# Patient Record
Sex: Female | Born: 1995 | Hispanic: Yes | Marital: Married | Smoking: Never smoker
Health system: Southern US, Community
[De-identification: ages and names within clinical notes are randomized; demographics above are authoritative.]

## PROBLEM LIST (undated history)

## (undated) DIAGNOSIS — Z87440 Personal history of urinary (tract) infections: Secondary | ICD-10-CM

## (undated) DIAGNOSIS — F419 Anxiety disorder, unspecified: Secondary | ICD-10-CM

## (undated) DIAGNOSIS — N200 Calculus of kidney: Secondary | ICD-10-CM

## (undated) DIAGNOSIS — T7840XA Allergy, unspecified, initial encounter: Secondary | ICD-10-CM

## (undated) DIAGNOSIS — K769 Liver disease, unspecified: Secondary | ICD-10-CM

## (undated) HISTORY — DX: Allergy, unspecified, initial encounter: T78.40XA

## (undated) HISTORY — DX: Anxiety disorder, unspecified: F41.9

---
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
## 2016-03-25 ENCOUNTER — Ambulatory Visit
Admission: EM | Admit: 2016-03-25 | Discharge: 2016-03-25 | Disposition: A | Discharge status: Home / Self Care | Payer: Medicaid Other | Attending: Emergency Medicine | Admitting: Emergency Medicine

## 2016-03-25 ENCOUNTER — Encounter: Payer: Self-pay | Admitting: *Deleted

## 2016-03-25 DIAGNOSIS — N3001 Acute cystitis with hematuria: Secondary | ICD-10-CM | POA: Insufficient documentation

## 2016-03-25 DIAGNOSIS — R300 Dysuria: Secondary | ICD-10-CM | POA: Diagnosis present

## 2016-03-25 LAB — URINALYSIS COMPLETE WITH MICROSCOPIC (ARMC ONLY)
BILIRUBIN URINE: NEGATIVE
Glucose, UA: NEGATIVE mg/dL
Ketones, ur: NEGATIVE mg/dL
NITRITE: NEGATIVE
PH: 5.5 (ref 5.0–8.0)
PROTEIN: 30 mg/dL — AB
Specific Gravity, Urine: 1.02 (ref 1.005–1.030)

## 2016-03-25 MED ORDER — NITROFURANTOIN MONOHYD MACRO 100 MG PO CAPS: 100.0000 mg | ORAL_CAPSULE | Freq: Two times a day (BID) | ORAL | 0 refills | Status: AC

## 2016-03-25 NOTE — ED Provider Notes (Signed)
CSN: [id number]     Arrival date & time 03/25/16  1128 History   None    Chief Complaint  Patient presents with  . Urinary Tract Infection   (Consider location/radiation/quality/duration/timing/severity/associated sxs/prior Treatment) Patient is a 20 y.o healthy female, presents today with concern for an UTI. Patient have had UTI before and states that "it feels exactly like her previous UTI". She reports sudden onset of symptoms started yesterday with dysuria, urinary frequency, urgency and blood in urine. She reports some lower abdominal pressure and very mild back pain on both sides. She denies nausea or vomiting.       History reviewed. No pertinent past medical history. History reviewed. No pertinent surgical history. History reviewed. No pertinent family history. Social History  Substance Use Topics  . Smoking status: Never Smoker  . Smokeless tobacco: Never Used  . Alcohol use No   OB History    No data available     Review of Systems  Constitutional: Negative for chills, fatigue and fever.  Respiratory: Negative for shortness of breath.   Cardiovascular: Negative for chest pain.  Gastrointestinal: Negative for abdominal pain, diarrhea, nausea and vomiting.  Genitourinary: Positive for dysuria, flank pain, frequency, hematuria and urgency. Negative for vaginal bleeding.  Musculoskeletal: Negative for back pain.    Allergies  Amoxicillin  Home Medications   Prior to Admission medications   Medication Sig Start Date End Date Taking? Authorizing Provider  nitrofurantoin, macrocrystal-monohydrate, (MACROBID) 100 MG capsule Take 1 capsule (100 mg total) by mouth 2 (two) times daily. 03/25/16 03/30/16  Lucia EstelleFeng Joaopedro Eschbach, NP   Meds Ordered and Administered this Visit  Medications - No data to display  BP (!) 107/59 (BP Location: Left Arm)   Pulse 81   Temp 98 F (36.7 C) (Oral)   Resp 16   Ht 5\' 3"  (1.6 m)   Wt 150 lb (68 kg)   LMP 03/14/2016   SpO2 100%   BMI 26.57  kg/m  No data found.   Physical Exam  Constitutional: She is oriented to person, place, and time. She appears well-developed and well-nourished.  HENT:  Head: Normocephalic and atraumatic.  Cardiovascular: Normal rate, regular rhythm and normal heart sounds.   Pulmonary/Chest: Effort normal and breath sounds normal.  Abdominal: Soft. Bowel sounds are normal. She exhibits no distension. There is no tenderness.  No tenderness but patient reports a pressure in the lower abdomen.   Genitourinary:  Genitourinary Comments: Negative CVA tenderness  Neurological: She is alert and oriented to person, place, and time.  Skin: Skin is warm and dry.  Vitals reviewed.   Urgent Care Course   Clinical Course     Procedures (including critical care time)  Labs Review Labs Reviewed  URINALYSIS COMPLETEWITH MICROSCOPIC ([facility] ONLY) - Abnormal; Notable for the following:       Result Value   Hgb urine dipstick MODERATE (*)    Protein, ur 30 (*)    Leukocytes, UA TRACE (*)    Bacteria, UA FEW (*)    Squamous Epithelial / LPF 0-5 (*)    All other components within normal limits  URINE CULTURE    Imaging Review No results found.   MDM   1. Acute cystitis with hematuria    UA has moderate hgb, protein, trace Leukocytes, and few bacteria. Doubt renal stone. Urine culture is pending. Will tx with Macrobid 100 mg BID x 5 days. Patient declined pyridium. Informed to f/u with PCP or return if she does not improve.  Lucia EstelleFeng Phillipa Morden, NP 03/25/16 1350

## 2016-03-25 NOTE — ED Triage Notes (Signed)
Patient started having symptoms of blood in urine and urinary frequency with pain. Patient does not have a history of chronic UTI.

## 2016-03-28 ENCOUNTER — Telehealth: Payer: Self-pay | Admitting: *Deleted

## 2016-03-28 LAB — URINE CULTURE

## 2016-03-28 NOTE — Telephone Encounter (Signed)
Called patient, verified DOB, communicated positive urine culture results. Patient reported feeling much better. Advised patient to follow up with PCP or [facility] if symptoms return. 

## 2017-03-18 DIAGNOSIS — O480 Post-term pregnancy: Secondary | ICD-10-CM | POA: Insufficient documentation

## 2020-05-15 ENCOUNTER — Encounter: Payer: Self-pay | Admitting: Emergency Medicine

## 2020-05-15 ENCOUNTER — Ambulatory Visit
Admission: EM | Admit: 2020-05-15 | Discharge: 2020-05-15 | Disposition: A | Discharge status: Home / Self Care | Attending: Internal Medicine | Admitting: Internal Medicine

## 2020-05-15 DIAGNOSIS — N3090 Cystitis, unspecified without hematuria: Secondary | ICD-10-CM | POA: Insufficient documentation

## 2020-05-15 HISTORY — DX: Personal history of urinary (tract) infections: Z87.440

## 2020-05-15 HISTORY — DX: Calculus of kidney: N20.0

## 2020-05-15 LAB — URINALYSIS, COMPLETE (UACMP) WITH MICROSCOPIC
Bilirubin Urine: NEGATIVE
Glucose, UA: NEGATIVE mg/dL
Ketones, ur: NEGATIVE mg/dL
Nitrite: NEGATIVE
Protein, ur: NEGATIVE mg/dL
Specific Gravity, Urine: 1.02 (ref 1.005–1.030)
pH: 7 (ref 5.0–8.0)

## 2020-05-15 MED ORDER — PHENAZOPYRIDINE HCL 200 MG PO TABS: 200.0000 mg | ORAL_TABLET | Freq: Three times a day (TID) | ORAL | 0 refills | Status: DC

## 2020-05-15 MED ORDER — NITROFURANTOIN MONOHYD MACRO 100 MG PO CAPS: 100.0000 mg | ORAL_CAPSULE | Freq: Two times a day (BID) | ORAL | 0 refills | Status: DC

## 2020-05-15 NOTE — ED Provider Notes (Signed)
[facility]    CSN: [id number] Arrival date & time: 05/15/20  1626      History   Chief Complaint Chief Complaint  Patient presents with  . Dysuria  . Back Pain    HPI Terri Wade is a 25 y.o. female who states that she developed dysuria 2 days ago and yesterday felt mild R back pain which does not radiate to her legs. Has a 75 months old and hold the baby with her R arm for a long time and things could be that since she does not recall injuring herself. Has hx of frequent UTI's, denies fever, chills or sweats. LMP 2 weeks ago.    Past Medical History:  Diagnosis Date  . Hx: UTI (urinary tract infection)   . Kidney stones     There are no problems to display for this patient.   Past Surgical History:  Procedure Laterality Date  . CESAREAN SECTION      OB History    Gravida  0   Para      Term      Preterm      AB      Living        SAB      IAB      Ectopic      Multiple      Live Births               Home Medications    Prior to Admission medications   Medication Sig Start Date End Date Taking? Authorizing Provider  nitrofurantoin, macrocrystal-monohydrate, (MACROBID) 100 MG capsule Take 1 capsule (100 mg total) by mouth 2 (two) times daily. 05/15/20  Yes Rodriguez-Southworth, Nettie Elm, PA-C  phenazopyridine (PYRIDIUM) 200 MG tablet Take 1 tablet (200 mg total) by mouth 3 (three) times daily. 05/15/20  Yes Rodriguez-Southworth, Nettie Elm, PA-C    Family History Family History  Problem Relation Age of Onset  . Healthy Mother   . Healthy Father     Social History Social History   Tobacco Use  . Smoking status: Never Smoker  . Smokeless tobacco: Never Used  Substance Use Topics  . Alcohol use: No  . Drug use: No     Allergies   Amoxicillin   Review of Systems Review of Systems  Constitutional: Negative for chills, diaphoresis and fever.  Gastrointestinal: Negative for abdominal pain.  Genitourinary: Positive  for dysuria and frequency. Negative for flank pain, pelvic pain and vaginal discharge.  Musculoskeletal: Positive for back pain. Negative for gait problem.  Skin: Negative for rash.     Physical Exam Triage Vital Signs ED Triage Vitals  Enc Vitals Group     BP 05/15/20 1701 109/67     Pulse Rate 05/15/20 1701 85     Resp 05/15/20 1701 18     Temp 05/15/20 1701 98.1 F (36.7 C)     Temp Source 05/15/20 1701 Oral     SpO2 05/15/20 1701 98 %     Weight --      Height --      Head Circumference --      Peak Flow --      Pain Score 05/15/20 1659 0     Pain Loc --      Pain Edu? --      Excl. in GC? --    No data found.  Updated Vital Signs BP 109/67 (BP Location: Right Arm)   Pulse 85   Temp 98.1 F (36.7  C) (Oral)   Resp 18   LMP 04/27/2020   SpO2 98%   Visual Acuity Right Eye Distance:   Left Eye Distance:   Bilateral Distance:    Right Eye Near:   Left Eye Near:    Bilateral Near:     Physical Exam Physical Exam Vitals and nursing note reviewed.  Constitutional:      General: She is not in acute distress.    Appearance: She is not toxic-appearing.  HENT:     Head: Normocephalic.     Right Ear: External ear normal.     Left Ear: External ear normal.  Eyes:     General: No scleral icterus.    Conjunctiva/sclera: Conjunctivae normal.  Pulmonary:     Effort: Pulmonary effort is normal.  Abdominal:     General: Bowel sounds are normal.     Palpations: Abdomen is soft. There is no mass.     Tenderness: There is no guarding or rebound.     Comments: - CVA tenderness   Musculoskeletal:        General: Normal range of motion.     Cervical back: Neck supple.     Comments: BACK- has  tenderness on R mid lumbar region with palpation.  Skin:    General: Skin is warm and dry.     Findings: No rash.  Neurological:     Mental Status: She is alert and oriented to person, place, and time.     Gait: Gait normal.  Psychiatric:        Mood and Affect: Mood  normal.        Behavior: Behavior normal.        Thought Content: Thought content normal.        Judgment: Judgment normal.     UC Treatments / Results  Labs (all labs ordered are listed, but only abnormal results are displayed) Labs Reviewed  URINALYSIS, COMPLETE (UACMP) WITH MICROSCOPIC - Abnormal; Notable for the following components:      Result Value   Hgb urine dipstick TRACE (*)    Leukocytes,Ua MODERATE (*)    Bacteria, UA FEW (*)    All other components within normal limits  URINE CULTURE    EKG   Radiology No results found.  Procedures Procedures (including critical care time)  Medications Ordered in UC Medications - No data to display  Initial Impression / Assessment and Plan / UC Course  I have reviewed the triage vital signs and the nursing notes. Has muscular R back pain/strain and UTI. Urine was sent for a culture and we will call her if we need to change her antibiotic. I placed her on Macrobid and Pyridium as noted.  May take Ibuprofen prn back pain, and use alternation of ice and heat.  Pertinent labs results that were available during my care of the patient were reviewed by me and considered in my medical decision making (see chart for details).  Final Clinical Impressions(s) / UC Diagnoses   Final diagnoses:  Cystitis   Discharge Instructions   None    ED Prescriptions    Medication Sig Dispense Auth. Provider   nitrofurantoin, macrocrystal-monohydrate, (MACROBID) 100 MG capsule Take 1 capsule (100 mg total) by mouth 2 (two) times daily. 10 capsule Rodriguez-Southworth, Nettie Elm, PA-C   phenazopyridine (PYRIDIUM) 200 MG tablet Take 1 tablet (200 mg total) by mouth 3 (three) times daily. 6 tablet Rodriguez-Southworth, Nettie Elm, PA-C     PDMP not reviewed this encounter.   Rodriguez-Southworth,  Nettie Elm, PA-C 05/15/20 1749

## 2020-05-15 NOTE — ED Triage Notes (Signed)
Pt states that two days ago she noticed dysuria and yesterday she felt lower back pain. Pt states that she has a hx of UTI.

## 2020-05-17 LAB — URINE CULTURE
Culture: <10000 — AB
Special Requests: NORMAL

## 2020-12-23 ENCOUNTER — Ambulatory Visit (INDEPENDENT_AMBULATORY_CARE_PROVIDER_SITE_OTHER): Admitting: Nurse Practitioner

## 2020-12-23 ENCOUNTER — Encounter: Payer: Self-pay | Admitting: Nurse Practitioner

## 2020-12-23 ENCOUNTER — Other Ambulatory Visit: Payer: Self-pay

## 2020-12-23 VITALS — BP 131/82 | HR 88 | Temp 98.7°F | Ht 62.8 in | Wt 217.0 lb

## 2020-12-23 DIAGNOSIS — K8020 Calculus of gallbladder without cholecystitis without obstruction: Secondary | ICD-10-CM | POA: Diagnosis not present

## 2020-12-23 DIAGNOSIS — R635 Abnormal weight gain: Secondary | ICD-10-CM | POA: Insufficient documentation

## 2020-12-23 DIAGNOSIS — Z114 Encounter for screening for human immunodeficiency virus [HIV]: Secondary | ICD-10-CM | POA: Diagnosis not present

## 2020-12-23 DIAGNOSIS — Z7689 Persons encountering health services in other specified circumstances: Secondary | ICD-10-CM

## 2020-12-23 DIAGNOSIS — Z1159 Encounter for screening for other viral diseases: Secondary | ICD-10-CM

## 2020-12-23 DIAGNOSIS — F339 Major depressive disorder, recurrent, unspecified: Secondary | ICD-10-CM

## 2020-12-23 HISTORY — DX: Major depressive disorder, recurrent, unspecified: F33.9

## 2020-12-23 MED ORDER — SERTRALINE HCL 25 MG PO TABS: 25.0000 mg | ORAL_TABLET | Freq: Every day | ORAL | 0 refills | Status: DC

## 2020-12-23 NOTE — Assessment & Plan Note (Signed)
Labs ordered today to rule out thyroid problem and check liver and kidney function.  Will begin Saxenda if lab work is normal. Discussed side effects and benefits of medication with patient during visit. Discussed how to properly use medication and titrate the medication. Discussed that it is best used with diet and exercise. Follow up in 1 month for reevaluation.

## 2020-12-23 NOTE — Assessment & Plan Note (Signed)
Diagnosed last year at a [facility] hospital.

## 2020-12-23 NOTE — Progress Notes (Signed)
BP 131/82   Pulse 88   Temp 98.7 F (37.1 C)   Ht 5' 2.8" (1.595 m)   Wt 217 lb (98.4 kg)   SpO2 98%   BMI 38.69 kg/m    Subjective:    Patient ID: Terri Wade, female    DOB: [birth year], 25 y.o.   MRN: [id number]  HPI: Terri Wade is a 25 y.o. female  Chief Complaint  Patient presents with   Annual Exam   Weight Gain    Patient states that she has tried diet and exercise, she will loose up to 15 pounds and start to gainit back   Depression   Patient presents to clinic to establish care with new PCP.  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  Patient states in the last two days she feels like she is almost having a panic attack.  She has had to sit down and take deep breaths and drink water.   Patient denies a history of: Hypertension, Elevated Cholesterol, Diabetes, Thyroid problems, Neurological problems, and Abdominal problems.   WEIGHT GAIN Patient states she had her son in 2018.  She gained a lot of weight after having him.  Patient states she did KETO and low carb and lost 10-15lbs then ended up gaining it back. Patient states after her second pregnancy she gained more weight. Patient states she recently tried [employer] and lost 10-15 lbs then started gaining it back.   [place] Office Visit from 12/23/2020 in [facility]  PHQ-9 Total Score 13       Denies HA, CP, SOB, dizziness, palpitations, visual changes, and lower extremity swelling.  Active Ambulatory Problems    Diagnosis Date Noted   Gallstones 12/23/2020   Depression, recurrent ([place]) 12/23/2020   Weight gain 12/23/2020   Resolved Ambulatory Problems    Diagnosis Date Noted   No Resolved Ambulatory Problems   Past Medical History:  Diagnosis Date   Allergy    Anxiety    Hx: UTI (urinary tract infection)    Kidney stones    Past Surgical History:  Procedure Laterality Date   CESAREAN SECTION     Family History  Problem Relation Age of Onset    Healthy Mother    Anxiety disorder Mother    Depression Mother    Miscarriages / Stillbirths Mother    Obesity Mother    Healthy Father    Cancer Maternal Grandmother    Early death Maternal Grandmother     Relevant past medical, surgical, family and social history reviewed and updated as indicated. Interim medical history since our last visit reviewed. Allergies and medications reviewed and updated.  Review of Systems  Constitutional:  Positive for unexpected weight change.  Eyes:  Negative for visual disturbance.  Respiratory:  Negative for cough, chest tightness and shortness of breath.   Cardiovascular:  Negative for chest pain, palpitations and leg swelling.  Neurological:  Negative for dizziness and headaches.  Psychiatric/Behavioral:  Positive for dysphoric mood. Negative for suicidal ideas.    Per HPI unless specifically indicated above     Objective:    BP 131/82   Pulse 88   Temp 98.7 F (37.1 C)   Ht 5' 2.8" (1.595 m)   Wt 217 lb (98.4 kg)   SpO2 98%   BMI 38.69 kg/m   Wt Readings from Last 3 Encounters:  12/23/20 217 lb (98.4 kg)  03/25/16 150 lb (68 kg) (80 %, Z= 0.84)*   *  Growth percentiles are based on CDC (Girls, 2-20 Years) data.    Physical Exam Vitals and nursing note reviewed.  Constitutional:      General: She is not in acute distress.    Appearance: Normal appearance. She is obese. She is not ill-appearing, toxic-appearing or diaphoretic.  HENT:     Head: Normocephalic.     Right Ear: External ear normal.     Left Ear: External ear normal.     Nose: Nose normal.     Mouth/Throat:     Mouth: Mucous membranes are moist.     Pharynx: Oropharynx is clear.  Eyes:     General:        Right eye: No discharge.        Left eye: No discharge.     Extraocular Movements: Extraocular movements intact.     Conjunctiva/sclera: Conjunctivae normal.     Pupils: Pupils are equal, round, and reactive to light.  Cardiovascular:     Rate and Rhythm:  Normal rate and regular rhythm.     Heart sounds: No murmur heard. Pulmonary:     Effort: Pulmonary effort is normal. No respiratory distress.     Breath sounds: Normal breath sounds. No wheezing or rales.  Musculoskeletal:     Cervical back: Normal range of motion and neck supple.  Skin:    General: Skin is warm and dry.     Capillary Refill: Capillary refill takes less than 2 seconds.  Neurological:     General: No focal deficit present.     Mental Status: She is alert and oriented to person, place, and time. Mental status is at baseline.  Psychiatric:        Mood and Affect: Mood normal.        Behavior: Behavior normal.        Thought Content: Thought content normal.        Judgment: Judgment normal.    Results for orders placed or performed during the hospital encounter of 05/15/20  Urine culture   Specimen: Urine, Clean Catch  Result Value Ref Range   Specimen Description      URINE, CLEAN CATCH Performed at [facility], [address], [place], [place] [address]    Special Requests      Normal Performed at [facility], [address], [place], [place] [address]    Culture (A)     <10,000 COLONIES/mL INSIGNIFICANT GROWTH Performed at [facility] [address], [place], [place] [address]    Report Status 05/17/2020 FINAL   Urinalysis, Complete w Microscopic Urine, Clean Catch  Result Value Ref Range   Color, Urine YELLOW YELLOW   APPearance CLEAR CLEAR   Specific Gravity, Urine 1.020 1.005 - 1.030   pH 7.0 5.0 - 8.0   Glucose, UA NEGATIVE NEGATIVE mg/dL   Hgb urine dipstick TRACE (A) NEGATIVE   Bilirubin Urine NEGATIVE NEGATIVE   Ketones, ur NEGATIVE NEGATIVE mg/dL   Protein, ur NEGATIVE NEGATIVE mg/dL   Nitrite NEGATIVE NEGATIVE   Leukocytes,Ua MODERATE (A) NEGATIVE   Squamous Epithelial / LPF 0-5 0 - 5   WBC, UA 11-20 0 - 5 WBC/hpf   RBC / HPF 6-10 0 - 5 RBC/hpf   Bacteria, UA FEW (A) NONE SEEN      Assessment & Plan:    Problem List Items Addressed This Visit       Digestive   Gallstones    Diagnosed last year at a  [place] hospital.          Other   Depression, recurrent ([place]) - Primary    Chronic. Ongoing.  Will begin Zoloft 101m daily.  Side effects and benefits of medication discussed during visit today.  Follow up in 1 month for reevaluation.       Relevant Medications   sertraline (ZOLOFT) 25 MG tablet   Other Relevant Orders   Comp Met (CMET)   TSH   CBC w/Diff   Weight gain    Labs ordered today to rule out thyroid problem and check liver and kidney function.  Will begin Saxenda if lab work is normal. Discussed side effects and benefits of medication with patient during visit. Discussed how to properly use medication and titrate the medication. Discussed that it is best used with diet and exercise. Follow up in 1 month for reevaluation.      Relevant Orders   Comp Met (CMET)   TSH   CBC w/Diff   Other Visit Diagnoses     Screening for HIV (human immunodeficiency virus)       Relevant Orders   Hepatitis C antibody   Encounter for hepatitis C screening test for low risk patient       Relevant Orders   HIV Antibody (routine testing w rflx)   Encounter to establish care            Follow up plan: Return in about 1 month (around 01/23/2021) for Physical and Fasting labs, Depression/Anxiety FU.

## 2020-12-23 NOTE — Assessment & Plan Note (Signed)
Chronic. Ongoing.  Will begin Zoloft 25mg  daily.  Side effects and benefits of medication discussed during visit today.  Follow up in 1 month for reevaluation.

## 2020-12-24 LAB — CBC WITH DIFFERENTIAL/PLATELET
Basophils Absolute: 0.1 10*3/uL (ref 0.0–0.2)
Basos: 1 %
EOS (ABSOLUTE): 0.3 10*3/uL (ref 0.0–0.4)
Eos: 3 %
Hematocrit: 38.4 % (ref 34.0–46.6)
Hemoglobin: 11.9 g/dL (ref 11.1–15.9)
Immature Grans (Abs): 0 10*3/uL (ref 0.0–0.1)
Immature Granulocytes: 0 %
Lymphocytes Absolute: 3.7 10*3/uL — ABNORMAL HIGH (ref 0.7–3.1)
Lymphs: 37 %
MCH: 23.9 pg — ABNORMAL LOW (ref 26.6–33.0)
MCHC: 31 g/dL — ABNORMAL LOW (ref 31.5–35.7)
MCV: 77 fL — ABNORMAL LOW (ref 79–97)
Monocytes Absolute: 0.6 10*3/uL (ref 0.1–0.9)
Monocytes: 6 %
Neutrophils Absolute: 5.2 10*3/uL (ref 1.4–7.0)
Neutrophils: 53 %
Platelets: 339 10*3/uL (ref 150–450)
RBC: 4.98 x10E6/uL (ref 3.77–5.28)
RDW: 14.5 % (ref 11.7–15.4)
WBC: 9.8 10*3/uL (ref 3.4–10.8)

## 2020-12-24 LAB — COMPREHENSIVE METABOLIC PANEL
ALT: 9 IU/L (ref 0–32)
AST: 15 IU/L (ref 0–40)
Albumin/Globulin Ratio: 1.7 (ref 1.2–2.2)
Albumin: 4.7 g/dL (ref 3.9–5.0)
Alkaline Phosphatase: 110 IU/L (ref 44–121)
BUN/Creatinine Ratio: 23 (ref 9–23)
BUN: 14 mg/dL (ref 6–20)
Bilirubin Total: <0.2 mg/dL (ref 0.0–1.2)
CO2: 26 mmol/L (ref 20–29)
Calcium: 9.3 mg/dL (ref 8.7–10.2)
Chloride: 100 mmol/L (ref 96–106)
Creatinine, Ser: 0.62 mg/dL (ref 0.57–1.00)
Globulin, Total: 2.7 g/dL (ref 1.5–4.5)
Glucose: 81 mg/dL (ref 65–99)
Potassium: 4.1 mmol/L (ref 3.5–5.2)
Sodium: 138 mmol/L (ref 134–144)
Total Protein: 7.4 g/dL (ref 6.0–8.5)
eGFR: 127 mL/min/{1.73_m2} (ref >59)

## 2020-12-24 LAB — HIV ANTIBODY (ROUTINE TESTING W REFLEX): HIV Screen 4th Generation wRfx: NONREACTIVE

## 2020-12-24 LAB — HEPATITIS C ANTIBODY: Hep C Virus Ab: <0.1 s/co ratio (ref 0.0–0.9)

## 2020-12-24 LAB — TSH: TSH: 3.44 u[IU]/mL (ref 0.450–4.500)

## 2020-12-24 MED ORDER — SAXENDA 18 MG/3ML ~~LOC~~ SOPN: 0.6000 mg | PEN_INJECTOR | Freq: Every day | SUBCUTANEOUS | 1 refills | Status: DC

## 2020-12-24 NOTE — Addendum Note (Signed)
Addended by: Larae Grooms on: 12/24/2020 08:56 PM   Modules accepted: Orders

## 2020-12-24 NOTE — Progress Notes (Signed)
Please let patient know that her lab work looks good.  I will go ahead and send in the prescription for the Saxenda.  Please let me know if she has any questions.

## 2020-12-25 ENCOUNTER — Telehealth: Payer: Self-pay

## 2020-12-25 NOTE — Telephone Encounter (Signed)
PA submitted via cover my meds for Saxenda. Awaiting approval or denial.  Key: [id number]

## 2021-01-02 NOTE — Telephone Encounter (Signed)
Patient must try and fail all of the follow for Saxenda to be covered Phentermine  Qsymia Xenical  Contrave

## 2021-01-07 MED ORDER — CONTRAVE 8-90 MG PO TB12: ORAL_TABLET | ORAL | 0 refills | Status: DC

## 2021-01-08 ENCOUNTER — Telehealth: Payer: Self-pay

## 2021-01-08 NOTE — Telephone Encounter (Signed)
PA for Contrave submitted and approved for Contrave. Sending mychart message to patient to notify her.

## 2021-01-13 ENCOUNTER — Encounter: Payer: Self-pay | Admitting: Nurse Practitioner

## 2021-01-13 ENCOUNTER — Other Ambulatory Visit: Payer: Self-pay

## 2021-01-13 ENCOUNTER — Ambulatory Visit (INDEPENDENT_AMBULATORY_CARE_PROVIDER_SITE_OTHER): Admitting: Nurse Practitioner

## 2021-01-13 VITALS — BP 114/73 | HR 79 | Temp 98.3°F | Ht 62.8 in | Wt 213.6 lb

## 2021-01-13 DIAGNOSIS — R635 Abnormal weight gain: Secondary | ICD-10-CM

## 2021-01-13 DIAGNOSIS — F339 Major depressive disorder, recurrent, unspecified: Secondary | ICD-10-CM | POA: Diagnosis not present

## 2021-01-13 MED ORDER — SERTRALINE HCL 25 MG PO TABS: 25.0000 mg | ORAL_TABLET | Freq: Every day | ORAL | 1 refills | Status: DC

## 2021-01-13 NOTE — Progress Notes (Signed)
BP 114/73   Pulse 79   Temp 98.3 F (36.8 C) (Oral)   Ht 5' 2.8" (1.595 m)   Wt 213 lb 9.6 oz (96.9 kg)   SpO2 97%   BMI 38.08 kg/m    Subjective:    Patient ID: Terri Wade, female    DOB: Oct 28, 1995, 25 y.o.   MRN: [id number]  HPI: Terri Wade is a 25 y.o. female  Chief Complaint  Patient presents with   Depression    DEPRESSION Feels like her mood has improved.  Feels like the Zoloft helped some with her mood but adding the Wellbutrin recently has really helped.  Denies SI.  [place] Office Visit from 01/13/2021 in [facility]  PHQ-9 Total Score 2       WEIGHT MANAGEMENT Patient states she has started the Contrave which is helping to curb her cravings and has noticed a decrease in her appetite. She is not having a lot of side effects from the medication.  Started it on 9/9.   Relevant past medical, surgical, family and social history reviewed and updated as indicated. Interim medical history since our last visit reviewed. Allergies and medications reviewed and updated.  Review of Systems  Psychiatric/Behavioral:  Positive for dysphoric mood. Negative for suicidal ideas.    Per HPI unless specifically indicated above     Objective:    BP 114/73   Pulse 79   Temp 98.3 F (36.8 C) (Oral)   Ht 5' 2.8" (1.595 m)   Wt 213 lb 9.6 oz (96.9 kg)   SpO2 97%   BMI 38.08 kg/m   Wt Readings from Last 3 Encounters:  01/13/21 213 lb 9.6 oz (96.9 kg)  12/23/20 217 lb (98.4 kg)  03/25/16 150 lb (68 kg) (80 %, Z= 0.84)*   * Growth percentiles are based on CDC (Girls, 2-20 Years) data.    Physical Exam Vitals and nursing note reviewed.  Constitutional:      General: She is not in acute distress.    Appearance: Normal appearance. She is normal weight. She is not ill-appearing, toxic-appearing or diaphoretic.  HENT:     Head: Normocephalic.     Right Ear: External ear normal.     Left Ear: External ear normal.     Nose: Nose  normal.     Mouth/Throat:     Mouth: Mucous membranes are moist.     Pharynx: Oropharynx is clear.  Eyes:     General:        Right eye: No discharge.        Left eye: No discharge.     Extraocular Movements: Extraocular movements intact.     Conjunctiva/sclera: Conjunctivae normal.     Pupils: Pupils are equal, round, and reactive to light.  Cardiovascular:     Rate and Rhythm: Normal rate and regular rhythm.     Heart sounds: No murmur heard. Pulmonary:     Effort: Pulmonary effort is normal. No respiratory distress.     Breath sounds: Normal breath sounds. No wheezing or rales.  Musculoskeletal:     Cervical back: Normal range of motion and neck supple.  Skin:    General: Skin is warm and dry.     Capillary Refill: Capillary refill takes less than 2 seconds.  Neurological:     General: No focal deficit present.     Mental Status: She is alert and oriented to person, place, and time. Mental status is at baseline.  Psychiatric:  Mood and Affect: Mood normal.        Behavior: Behavior normal.        Thought Content: Thought content normal.        Judgment: Judgment normal.    Results for orders placed or performed in visit on 12/23/20  HIV Antibody (routine testing w rflx)  Result Value Ref Range   HIV Screen 4th Generation wRfx Non Reactive Non Reactive  Hepatitis C antibody  Result Value Ref Range   Hep C Virus Ab <0.1 0.0 - 0.9 s/co ratio  Comp Met (CMET)  Result Value Ref Range   Glucose 81 65 - 99 mg/dL   BUN 14 6 - 20 mg/dL   Creatinine, Ser 0.62 0.57 - 1.00 mg/dL   eGFR 127 >59 mL/min/1.73   BUN/Creatinine Ratio 23 9 - 23   Sodium 138 134 - 144 mmol/L   Potassium 4.1 3.5 - 5.2 mmol/L   Chloride 100 96 - 106 mmol/L   CO2 26 20 - 29 mmol/L   Calcium 9.3 8.7 - 10.2 mg/dL   Total Protein 7.4 6.0 - 8.5 g/dL   Albumin 4.7 3.9 - 5.0 g/dL   Globulin, Total 2.7 1.5 - 4.5 g/dL   Albumin/Globulin Ratio 1.7 1.2 - 2.2   Bilirubin Total <0.2 0.0 - 1.2 mg/dL    Alkaline Phosphatase 110 44 - 121 IU/L   AST 15 0 - 40 IU/L   ALT 9 0 - 32 IU/L  TSH  Result Value Ref Range   TSH 3.440 0.450 - 4.500 uIU/mL  CBC w/Diff  Result Value Ref Range   WBC 9.8 3.4 - 10.8 x10E3/uL   RBC 4.98 3.77 - 5.28 x10E6/uL   Hemoglobin 11.9 11.1 - 15.9 g/dL   Hematocrit 38.4 34.0 - 46.6 %   MCV 77 (L) 79 - 97 fL   MCH 23.9 (L) 26.6 - 33.0 pg   MCHC 31.0 (L) 31.5 - 35.7 g/dL   RDW 14.5 11.7 - 15.4 %   Platelets 339 150 - 450 x10E3/uL   Neutrophils 53 Not Estab. %   Lymphs 37 Not Estab. %   Monocytes 6 Not Estab. %   Eos 3 Not Estab. %   Basos 1 Not Estab. %   Neutrophils Absolute 5.2 1.4 - 7.0 x10E3/uL   Lymphocytes Absolute 3.7 (H) 0.7 - 3.1 x10E3/uL   Monocytes Absolute 0.6 0.1 - 0.9 x10E3/uL   EOS (ABSOLUTE) 0.3 0.0 - 0.4 x10E3/uL   Basophils Absolute 0.1 0.0 - 0.2 x10E3/uL   Immature Granulocytes 0 Not Estab. %   Immature Grans (Abs) 0.0 0.0 - 0.1 x10E3/uL      Assessment & Plan:   Problem List Items Addressed This Visit       Other   Depression, recurrent ([facility]) - Primary    Chronic. Improved.  Continue with Zoloft 64m. Refill sent today. Once patient stops Contrave may need to increase Zoloft if mood worsens.  Patient understands and agrees with the plan of care.      Relevant Medications   sertraline (ZOLOFT) 25 MG tablet   Weight gain    Ongoing. Contrave is going well. Patient denies significant side effects.  Continue with medication.  Follow up in 1 month for reevaluation.         Follow up plan: Return if symptoms worsen or fail to improve.

## 2021-01-13 NOTE — Assessment & Plan Note (Signed)
Chronic. Improved.  Continue with Zoloft 25mg . Refill sent today. Once patient stops Contrave may need to increase Zoloft if mood worsens.  Patient understands and agrees with the plan of care.

## 2021-01-13 NOTE — Assessment & Plan Note (Signed)
Ongoing. Contrave is going well. Patient denies significant side effects.  Continue with medication.  Follow up in 1 month for reevaluation.

## 2021-01-31 ENCOUNTER — Ambulatory Visit: Admitting: Nurse Practitioner

## 2021-02-03 ENCOUNTER — Ambulatory Visit (INDEPENDENT_AMBULATORY_CARE_PROVIDER_SITE_OTHER): Admitting: Nurse Practitioner

## 2021-02-03 ENCOUNTER — Encounter: Payer: Self-pay | Admitting: Nurse Practitioner

## 2021-02-03 ENCOUNTER — Other Ambulatory Visit: Payer: Self-pay

## 2021-02-03 ENCOUNTER — Other Ambulatory Visit (HOSPITAL_COMMUNITY)
Admission: RE | Admit: 2021-02-03 | Discharge: 2021-02-03 | Disposition: A | Discharge status: Home / Self Care | Source: Ambulatory Visit | Attending: Nurse Practitioner | Admitting: Nurse Practitioner

## 2021-02-03 VITALS — BP 114/77 | HR 95 | Ht 62.0 in | Wt 207.0 lb

## 2021-02-03 DIAGNOSIS — Z0000 Encounter for general adult medical examination without abnormal findings: Secondary | ICD-10-CM

## 2021-02-03 DIAGNOSIS — F339 Major depressive disorder, recurrent, unspecified: Secondary | ICD-10-CM | POA: Diagnosis not present

## 2021-02-03 DIAGNOSIS — Z124 Encounter for screening for malignant neoplasm of cervix: Secondary | ICD-10-CM | POA: Insufficient documentation

## 2021-02-03 DIAGNOSIS — R635 Abnormal weight gain: Secondary | ICD-10-CM | POA: Diagnosis not present

## 2021-02-03 LAB — URINALYSIS, ROUTINE W REFLEX MICROSCOPIC
Bilirubin, UA: NEGATIVE
Glucose, UA: NEGATIVE
Ketones, UA: NEGATIVE
Leukocytes,UA: NEGATIVE
Nitrite, UA: NEGATIVE
Protein,UA: NEGATIVE
RBC, UA: NEGATIVE
Specific Gravity, UA: 1.025 (ref 1.005–1.030)
Urobilinogen, Ur: 1 mg/dL (ref 0.2–1.0)
pH, UA: 5.5 (ref 5.0–7.5)

## 2021-02-03 MED ORDER — CONTRAVE 8-90 MG PO TB12: ORAL_TABLET | ORAL | 0 refills | Status: DC

## 2021-02-03 NOTE — Assessment & Plan Note (Addendum)
Chronic.  Controlled.  Has lost 6lbs since last visit. Can continue 2 pills in the am and 1 pill in the pm due to side effects.   Labs ordered today.  Return to clinic in 3 months for reevaluation.  Call sooner if concerns arise.  Refills sent today.

## 2021-02-03 NOTE — Progress Notes (Signed)
Hi Pria.  It was good to see you today.  Your urine from today looks good.  I will send you another message once the rest of your lab work comes back.

## 2021-02-03 NOTE — Progress Notes (Signed)
BP 114/77   Pulse 95   Ht '5\' 2"'  (1.575 m)   Wt 207 lb (93.9 kg)   BMI 37.86 kg/m    Subjective:    Patient ID: Terri Wade, female    DOB: 11-04-1995, 25 y.o.   MRN: [id number]  HPI: Terri Wade is a 25 y.o. female presenting on 02/03/2021 for comprehensive medical examination. Current medical complaints include:none  She currently lives with: Menopausal Symptoms: no  Denies HA, CP, SOB, dizziness, palpitations, visual changes, and lower extremity swelling.  WEIGHT MANAGEMENT Patient is currently taking the contrave.  She went to 2 pills in the morning and 2 in the evening and it is causing her to be shaking and irritable.  Otherwise the medication is going well. She is down 6lbs in the last week.  DEPRESSION Patient states her depression is going well.  Feels like she was more anxious with the 2 contrave in the morning and 2 in the evening but since she went back down she feels like it is going well. Denies SI. Depression Screen done today and results listed below:  Depression screen [facility] 2/9 01/13/2021 12/23/2020  Decreased Interest 0 0  Down, Depressed, Hopeless 0 1  PHQ - 2 Score 0 1  Altered sleeping 1 3  Tired, decreased energy 1 3  Change in appetite 0 3  Feeling bad or failure about yourself  0 3  Trouble concentrating 0 0  Moving slowly or fidgety/restless 0 0  Suicidal thoughts 0 0  PHQ-9 Score 2 13  Difficult doing work/chores Not difficult at all Somewhat difficult    The patient does not have a history of falls. I did complete a risk assessment for falls. A plan of care for falls was documented.   Past Medical History:  Past Medical History:  Diagnosis Date   Allergy    Amoxicillin   Anxiety    Depression, recurrent ([place]) 12/23/2020   Hx: UTI (urinary tract infection)    Kidney stones     Surgical History:  Past Surgical History:  Procedure Laterality Date   CESAREAN SECTION      Medications:  Current Outpatient Medications on  File Prior to Visit  Medication Sig   levonorgestrel (MIRENA) 20 MCG/DAY IUD 1 each by Intrauterine route once.   No current facility-administered medications on file prior to visit.    Allergies:  Allergies  Allergen Reactions   Amoxicillin Hives    Social History:  Social History   Socioeconomic History   Marital status: Married    Spouse name: Not on file   Number of children: Not on file   Years of education: Not on file   Highest education level: Not on file  Occupational History   Not on file  Tobacco Use   Smoking status: Never   Smokeless tobacco: Never  Vaping Use   Vaping Use: Never used  Substance and Sexual Activity   Alcohol use: No   Drug use: No   Sexual activity: Yes    Birth control/protection: I.U.D.  Other Topics Concern   Not on file  Social History Narrative   Not on file   Social Determinants of Health   Financial Resource Strain: Not on file  Food Insecurity: Not on file  Transportation Needs: Not on file  Physical Activity: Not on file  Stress: Not on file  Social Connections: Not on file  Intimate Partner Violence: Not on file   Social History   Tobacco Use  Smoking Status Never  Smokeless Tobacco Never   Social History   Substance and Sexual Activity  Alcohol Use No    Family History:  Family History  Problem Relation Age of Onset   Healthy Mother    Anxiety disorder Mother    Depression Mother    Miscarriages / Stillbirths Mother    Obesity Mother    Healthy Father    Cancer Maternal Grandmother    Early death Maternal Grandmother     Past medical history, surgical history, medications, allergies, family history and social history reviewed with patient today and changes made to appropriate areas of the chart.   Review of Systems  Eyes:  Negative for blurred vision and double vision.  Respiratory:  Negative for shortness of breath.   Cardiovascular:  Negative for chest pain, palpitations and leg swelling.   Neurological:  Negative for dizziness and headaches.  Psychiatric/Behavioral:  Negative for depression and suicidal ideas.   All other ROS negative except what is listed above and in the HPI.      Objective:    BP 114/77   Pulse 95   Ht '5\' 2"'  (1.575 m)   Wt 207 lb (93.9 kg)   BMI 37.86 kg/m   Wt Readings from Last 3 Encounters:  02/03/21 207 lb (93.9 kg)  01/13/21 213 lb 9.6 oz (96.9 kg)  12/23/20 217 lb (98.4 kg)    Physical Exam Vitals and nursing note reviewed. Exam conducted with a chaperone present Elizabeth Palau, CMA).  Constitutional:      General: She is awake. She is not in acute distress.    Appearance: She is well-developed. She is not ill-appearing.  HENT:     Head: Normocephalic and atraumatic.     Right Ear: Hearing, tympanic membrane, ear canal and external ear normal. No drainage.     Left Ear: Hearing, tympanic membrane, ear canal and external ear normal. No drainage.     Nose: Nose normal.     Right Sinus: No maxillary sinus tenderness or frontal sinus tenderness.     Left Sinus: No maxillary sinus tenderness or frontal sinus tenderness.     Mouth/Throat:     Mouth: Mucous membranes are moist.     Pharynx: Oropharynx is clear. Uvula midline. No pharyngeal swelling, oropharyngeal exudate or posterior oropharyngeal erythema.  Eyes:     General: Lids are normal.        Right eye: No discharge.        Left eye: No discharge.     Extraocular Movements: Extraocular movements intact.     Conjunctiva/sclera: Conjunctivae normal.     Pupils: Pupils are equal, round, and reactive to light.     Visual Fields: Right eye visual fields normal and left eye visual fields normal.  Neck:     Thyroid: No thyromegaly.     Vascular: No carotid bruit.     Trachea: Trachea normal.  Cardiovascular:     Rate and Rhythm: Normal rate and regular rhythm.     Heart sounds: Normal heart sounds. No murmur heard.   No gallop.  Pulmonary:     Effort: Pulmonary effort is normal.  No accessory muscle usage or respiratory distress.     Breath sounds: Normal breath sounds.  Chest:  Breasts:    Right: Normal.     Left: Normal.  Abdominal:     General: Bowel sounds are normal.     Palpations: Abdomen is soft. There is no hepatomegaly or splenomegaly.  Tenderness: There is no abdominal tenderness.  Genitourinary:    General: Normal vulva.     Vagina: Normal.     Cervix: Discharge present. No friability or erythema.     Adnexa: Right adnexa normal and left adnexa normal.  Musculoskeletal:        General: Normal range of motion.     Cervical back: Normal range of motion and neck supple.     Right lower leg: No edema.     Left lower leg: No edema.  Lymphadenopathy:     Head:     Right side of head: No submental, submandibular, tonsillar, preauricular or posterior auricular adenopathy.     Left side of head: No submental, submandibular, tonsillar, preauricular or posterior auricular adenopathy.     Cervical: No cervical adenopathy.     Upper Body:     Right upper body: No supraclavicular, axillary or pectoral adenopathy.     Left upper body: No supraclavicular, axillary or pectoral adenopathy.  Skin:    General: Skin is warm and dry.     Capillary Refill: Capillary refill takes less than 2 seconds.     Findings: No rash.  Neurological:     Mental Status: She is alert and oriented to person, place, and time.     Cranial Nerves: Cranial nerves are intact.     Gait: Gait is intact.     Deep Tendon Reflexes: Reflexes are normal and symmetric.     Reflex Scores:      Brachioradialis reflexes are 2+ on the right side and 2+ on the left side.      Patellar reflexes are 2+ on the right side and 2+ on the left side. Psychiatric:        Attention and Perception: Attention normal.        Mood and Affect: Mood normal.        Speech: Speech normal.        Behavior: Behavior normal. Behavior is cooperative.        Thought Content: Thought content normal.         Judgment: Judgment normal.    Results for orders placed or performed in visit on 12/23/20  HIV Antibody (routine testing w rflx)  Result Value Ref Range   HIV Screen 4th Generation wRfx Non Reactive Non Reactive  Hepatitis C antibody  Result Value Ref Range   Hep C Virus Ab <0.1 0.0 - 0.9 s/co ratio  Comp Met (CMET)  Result Value Ref Range   Glucose 81 65 - 99 mg/dL   BUN 14 6 - 20 mg/dL   Creatinine, Ser 0.62 0.57 - 1.00 mg/dL   eGFR 127 >59 mL/min/1.73   BUN/Creatinine Ratio 23 9 - 23   Sodium 138 134 - 144 mmol/L   Potassium 4.1 3.5 - 5.2 mmol/L   Chloride 100 96 - 106 mmol/L   CO2 26 20 - 29 mmol/L   Calcium 9.3 8.7 - 10.2 mg/dL   Total Protein 7.4 6.0 - 8.5 g/dL   Albumin 4.7 3.9 - 5.0 g/dL   Globulin, Total 2.7 1.5 - 4.5 g/dL   Albumin/Globulin Ratio 1.7 1.2 - 2.2   Bilirubin Total <0.2 0.0 - 1.2 mg/dL   Alkaline Phosphatase 110 44 - 121 IU/L   AST 15 0 - 40 IU/L   ALT 9 0 - 32 IU/L  TSH  Result Value Ref Range   TSH 3.440 0.450 - 4.500 uIU/mL  CBC w/Diff  Result Value Ref Range  WBC 9.8 3.4 - 10.8 x10E3/uL   RBC 4.98 3.77 - 5.28 x10E6/uL   Hemoglobin 11.9 11.1 - 15.9 g/dL   Hematocrit 38.4 34.0 - 46.6 %   MCV 77 (L) 79 - 97 fL   MCH 23.9 (L) 26.6 - 33.0 pg   MCHC 31.0 (L) 31.5 - 35.7 g/dL   RDW 14.5 11.7 - 15.4 %   Platelets 339 150 - 450 x10E3/uL   Neutrophils 53 Not Estab. %   Lymphs 37 Not Estab. %   Monocytes 6 Not Estab. %   Eos 3 Not Estab. %   Basos 1 Not Estab. %   Neutrophils Absolute 5.2 1.4 - 7.0 x10E3/uL   Lymphocytes Absolute 3.7 (H) 0.7 - 3.1 x10E3/uL   Monocytes Absolute 0.6 0.1 - 0.9 x10E3/uL   EOS (ABSOLUTE) 0.3 0.0 - 0.4 x10E3/uL   Basophils Absolute 0.1 0.0 - 0.2 x10E3/uL   Immature Granulocytes 0 Not Estab. %   Immature Grans (Abs) 0.0 0.0 - 0.1 x10E3/uL      Assessment & Plan:   Problem List Items Addressed This Visit       Other   Depression, recurrent ([facility])    Chronic.  Controlled.  Continue with current medication  regimen of Wellbutrin.  Discussed that when Contrave is done can continue Wellbutrin if needed.  Labs ordered today.  Return to clinic in 3 months for reevaluation.  Call sooner if concerns arise.        Weight gain    Chronic.  Controlled.  Has lost 6lbs since last visit. Can continue 2 pills in the am and 1 pill in the pm due to side effects.   Labs ordered today.  Return to clinic in 3 months for reevaluation.  Call sooner if concerns arise.        Other Visit Diagnoses     Annual physical exam    -  Primary   Health Maintenance reviewed during visit today. PAP obtained during visit today. Labs ordered.  Getting flu shot at work tomorrow.    Relevant Orders   CBC with Differential/Platelet   Comprehensive metabolic panel   Lipid panel   TSH   Urinalysis, Routine w reflex microscopic   Screening for cervical cancer       PAP obtained in normal fashion. Patient tolerated obtaining of PAP without complication. Escorted by Elizabeth Palau, CMA.   Relevant Orders   Cytology - PAP        Follow up plan: Return in about 3 months (around 05/06/2021) for Weight Managment.   LABORATORY TESTING:  - Pap smear: pap done  IMMUNIZATIONS:   - Tdap: Tetanus vaccination status reviewed: last tetanus booster within 10 years. - Influenza: Given elsewhere - Pneumovax: Not applicable - Prevnar: Not applicable - HPV: Up to date - Zostavax vaccine: Not applicable  SCREENING: -Mammogram: Not applicable  - Colonoscopy: Not applicable  - Bone Density: Not applicable  -Hearing Test: Not applicable  -Spirometry: Not applicable   PATIENT COUNSELING:   Advised to take 1 mg of folate supplement per day if capable of pregnancy.   Sexuality: Discussed sexually transmitted diseases, partner selection, use of condoms, avoidance of unintended pregnancy  and contraceptive alternatives.   Advised to avoid cigarette smoking.  I discussed with the patient that most people either abstain from alcohol  or drink within safe limits (<=14/week and <=4 drinks/occasion for males, <=7/weeks and <= 3 drinks/occasion for females) and that the risk for alcohol disorders and other health effects  rises proportionally with the number of drinks per week and how often a drinker exceeds daily limits.  Discussed cessation/primary prevention of drug use and availability of treatment for abuse.   Diet: Encouraged to adjust caloric intake to maintain  or achieve ideal body weight, to reduce intake of dietary saturated fat and total fat, to limit sodium intake by avoiding high sodium foods and not adding table salt, and to maintain adequate dietary potassium and calcium preferably from fresh fruits, vegetables, and low-fat dairy products.    stressed the importance of regular exercise  Injury prevention: Discussed safety belts, safety helmets, smoke detector, smoking near bedding or upholstery.   Dental health: Discussed importance of regular tooth brushing, flossing, and dental visits.    NEXT PREVENTATIVE PHYSICAL DUE IN 1 YEAR. Return in about 3 months (around 05/06/2021) for Weight Managment.

## 2021-02-03 NOTE — Assessment & Plan Note (Addendum)
Chronic.  Controlled.  Continue with current medication regimen of Wellbutrin.  Discussed that when Contrave is done can continue Wellbutrin if needed.  Labs ordered today.  Return to clinic in 3 months for reevaluation.  Call sooner if concerns arise.

## 2021-02-04 LAB — CYTOLOGY - PAP: Diagnosis: NEGATIVE

## 2021-02-04 LAB — COMPREHENSIVE METABOLIC PANEL
ALT: 9 IU/L (ref 0–32)
AST: 14 IU/L (ref 0–40)
Albumin/Globulin Ratio: 1.8 (ref 1.2–2.2)
Albumin: 5 g/dL (ref 3.9–5.0)
Alkaline Phosphatase: 106 IU/L (ref 44–121)
BUN/Creatinine Ratio: 14 (ref 9–23)
BUN: 10 mg/dL (ref 6–20)
Bilirubin Total: 0.3 mg/dL (ref 0.0–1.2)
CO2: 22 mmol/L (ref 20–29)
Calcium: 9.5 mg/dL (ref 8.7–10.2)
Chloride: 100 mmol/L (ref 96–106)
Creatinine, Ser: 0.71 mg/dL (ref 0.57–1.00)
Globulin, Total: 2.8 g/dL (ref 1.5–4.5)
Glucose: 90 mg/dL (ref 70–99)
Potassium: 4.2 mmol/L (ref 3.5–5.2)
Sodium: 138 mmol/L (ref 134–144)
Total Protein: 7.8 g/dL (ref 6.0–8.5)
eGFR: 122 mL/min/{1.73_m2} (ref >59)

## 2021-02-04 LAB — CBC WITH DIFFERENTIAL/PLATELET
Basophils Absolute: 0.1 10*3/uL (ref 0.0–0.2)
Basos: 1 %
EOS (ABSOLUTE): 0.1 10*3/uL (ref 0.0–0.4)
Eos: 1 %
Hematocrit: 40.1 % (ref 34.0–46.6)
Hemoglobin: 12.8 g/dL (ref 11.1–15.9)
Immature Grans (Abs): 0 10*3/uL (ref 0.0–0.1)
Immature Granulocytes: 0 %
Lymphocytes Absolute: 2.2 10*3/uL (ref 0.7–3.1)
Lymphs: 24 %
MCH: 24 pg — ABNORMAL LOW (ref 26.6–33.0)
MCHC: 31.9 g/dL (ref 31.5–35.7)
MCV: 75 fL — ABNORMAL LOW (ref 79–97)
Monocytes Absolute: 0.5 10*3/uL (ref 0.1–0.9)
Monocytes: 5 %
Neutrophils Absolute: 6.2 10*3/uL (ref 1.4–7.0)
Neutrophils: 69 %
Platelets: 348 10*3/uL (ref 150–450)
RBC: 5.34 x10E6/uL — ABNORMAL HIGH (ref 3.77–5.28)
RDW: 14.1 % (ref 11.7–15.4)
WBC: 9 10*3/uL (ref 3.4–10.8)

## 2021-02-04 LAB — LIPID PANEL
Chol/HDL Ratio: 2.9 ratio (ref 0.0–4.4)
Cholesterol, Total: 134 mg/dL (ref 100–199)
HDL: 46 mg/dL (ref >39)
LDL Chol Calc (NIH): 74 mg/dL (ref 0–99)
Triglycerides: 71 mg/dL (ref 0–149)
VLDL Cholesterol Cal: 14 mg/dL (ref 5–40)

## 2021-02-04 LAB — TSH: TSH: 2.82 u[IU]/mL (ref 0.450–4.500)

## 2021-02-04 NOTE — Progress Notes (Signed)
Hi Alishah.  Your blood work from yesterday looks good. Let me know if you have any questions.  See you at our follow up.

## 2021-02-05 NOTE — Progress Notes (Signed)
Hi Ron.  Your PAP is normal. Please let me know if you have any questions. We will repeat in 3 years.

## 2021-03-17 ENCOUNTER — Encounter: Payer: Self-pay | Admitting: Nurse Practitioner

## 2021-03-17 ENCOUNTER — Other Ambulatory Visit: Payer: Self-pay

## 2021-03-17 ENCOUNTER — Ambulatory Visit (INDEPENDENT_AMBULATORY_CARE_PROVIDER_SITE_OTHER): Admitting: Nurse Practitioner

## 2021-03-17 VITALS — BP 120/66 | HR 90 | Temp 98.3°F | Ht 62.5 in | Wt 209.8 lb

## 2021-03-17 DIAGNOSIS — R635 Abnormal weight gain: Secondary | ICD-10-CM

## 2021-03-17 DIAGNOSIS — Z23 Encounter for immunization: Secondary | ICD-10-CM | POA: Diagnosis not present

## 2021-03-17 MED ORDER — SAXENDA 18 MG/3ML ~~LOC~~ SOPN: 0.6000 mg | PEN_INJECTOR | Freq: Every day | SUBCUTANEOUS | 0 refills | Status: DC

## 2021-03-17 NOTE — Progress Notes (Signed)
BP 120/66   Pulse 90   Temp 98.3 F (36.8 C) (Oral)   Ht 5' 2.5" (1.588 m)   Wt 209 lb 12.8 oz (95.2 kg)   LMP 02/16/2021 (Exact Date)   SpO2 98%   BMI 37.76 kg/m    Subjective:    Patient ID: Terri Wade, female    DOB: 09-27-1995, 25 y.o.   MRN: [id number]  HPI: Terri Wade is a 25 y.o. female  Chief Complaint  Patient presents with   Weight Check    Pt states she could not tolerate the contrave at the full dose.    WEIGHT MANAGEMENT Patient presents to clinic for follow up on weight loss.  States she wasn't able to tolerate the Contrave at the full dose.  She is currently taking anything.  She had weaned herself down and then stopped the medication.  Patient states she felt very hyper aware of her surroundings.   Relevant past medical, surgical, family and social history reviewed and updated as indicated. Interim medical history since our last visit reviewed. Allergies and medications reviewed and updated.  Review of Systems  Constitutional:  Positive for unexpected weight change.   Per HPI unless specifically indicated above     Objective:    BP 120/66   Pulse 90   Temp 98.3 F (36.8 C) (Oral)   Ht 5' 2.5" (1.588 m)   Wt 209 lb 12.8 oz (95.2 kg)   LMP 02/16/2021 (Exact Date)   SpO2 98%   BMI 37.76 kg/m   Wt Readings from Last 3 Encounters:  03/17/21 209 lb 12.8 oz (95.2 kg)  02/03/21 207 lb (93.9 kg)  01/13/21 213 lb 9.6 oz (96.9 kg)    Physical Exam Vitals and nursing note reviewed.  Constitutional:      General: She is not in acute distress.    Appearance: Normal appearance. She is normal weight. She is not ill-appearing, toxic-appearing or diaphoretic.  HENT:     Head: Normocephalic.     Right Ear: External ear normal.     Left Ear: External ear normal.     Nose: Nose normal.     Mouth/Throat:     Mouth: Mucous membranes are moist.     Pharynx: Oropharynx is clear.  Eyes:     General:        Right eye: No discharge.         Left eye: No discharge.     Extraocular Movements: Extraocular movements intact.     Conjunctiva/sclera: Conjunctivae normal.     Pupils: Pupils are equal, round, and reactive to light.  Cardiovascular:     Rate and Rhythm: Normal rate and regular rhythm.     Heart sounds: No murmur heard. Pulmonary:     Effort: Pulmonary effort is normal. No respiratory distress.     Breath sounds: Normal breath sounds. No wheezing or rales.  Musculoskeletal:     Cervical back: Normal range of motion and neck supple.  Skin:    General: Skin is warm and dry.     Capillary Refill: Capillary refill takes less than 2 seconds.  Neurological:     General: No focal deficit present.     Mental Status: She is alert and oriented to person, place, and time. Mental status is at baseline.  Psychiatric:        Mood and Affect: Mood normal.        Behavior: Behavior normal.  Thought Content: Thought content normal.        Judgment: Judgment normal.    Results for orders placed or performed in visit on 02/03/21  CBC with Differential/Platelet  Result Value Ref Range   WBC 9.0 3.4 - 10.8 x10E3/uL   RBC 5.34 (H) 3.77 - 5.28 x10E6/uL   Hemoglobin 12.8 11.1 - 15.9 g/dL   Hematocrit 40.1 34.0 - 46.6 %   MCV 75 (L) 79 - 97 fL   MCH 24.0 (L) 26.6 - 33.0 pg   MCHC 31.9 31.5 - 35.7 g/dL   RDW 14.1 11.7 - 15.4 %   Platelets 348 150 - 450 x10E3/uL   Neutrophils 69 Not Estab. %   Lymphs 24 Not Estab. %   Monocytes 5 Not Estab. %   Eos 1 Not Estab. %   Basos 1 Not Estab. %   Neutrophils Absolute 6.2 1.4 - 7.0 x10E3/uL   Lymphocytes Absolute 2.2 0.7 - 3.1 x10E3/uL   Monocytes Absolute 0.5 0.1 - 0.9 x10E3/uL   EOS (ABSOLUTE) 0.1 0.0 - 0.4 x10E3/uL   Basophils Absolute 0.1 0.0 - 0.2 x10E3/uL   Immature Granulocytes 0 Not Estab. %   Immature Grans (Abs) 0.0 0.0 - 0.1 x10E3/uL  Comprehensive metabolic panel  Result Value Ref Range   Glucose 90 70 - 99 mg/dL   BUN 10 6 - 20 mg/dL   Creatinine, Ser 0.71  0.57 - 1.00 mg/dL   eGFR 122 >59 mL/min/1.73   BUN/Creatinine Ratio 14 9 - 23   Sodium 138 134 - 144 mmol/L   Potassium 4.2 3.5 - 5.2 mmol/L   Chloride 100 96 - 106 mmol/L   CO2 22 20 - 29 mmol/L   Calcium 9.5 8.7 - 10.2 mg/dL   Total Protein 7.8 6.0 - 8.5 g/dL   Albumin 5.0 3.9 - 5.0 g/dL   Globulin, Total 2.8 1.5 - 4.5 g/dL   Albumin/Globulin Ratio 1.8 1.2 - 2.2   Bilirubin Total 0.3 0.0 - 1.2 mg/dL   Alkaline Phosphatase 106 44 - 121 IU/L   AST 14 0 - 40 IU/L   ALT 9 0 - 32 IU/L  Lipid panel  Result Value Ref Range   Cholesterol, Total 134 100 - 199 mg/dL   Triglycerides 71 0 - 149 mg/dL   HDL 46 >39 mg/dL   VLDL Cholesterol Cal 14 5 - 40 mg/dL   LDL Chol Calc (NIH) 74 0 - 99 mg/dL   Chol/HDL Ratio 2.9 0.0 - 4.4 ratio  TSH  Result Value Ref Range   TSH 2.820 0.450 - 4.500 uIU/mL  Urinalysis, Routine w reflex microscopic  Result Value Ref Range   Specific Gravity, UA 1.025 1.005 - 1.030   pH, UA 5.5 5.0 - 7.5   Color, UA Yellow Yellow   Appearance Ur Cloudy (A) Clear   Leukocytes,UA Negative Negative   Protein,UA Negative Negative/Trace   Glucose, UA Negative Negative   Ketones, UA Negative Negative   RBC, UA Negative Negative   Bilirubin, UA Negative Negative   Urobilinogen, Ur 1.0 0.2 - 1.0 mg/dL   Nitrite, UA Negative Negative  Cytology - PAP  Result Value Ref Range   Adequacy      Satisfactory for evaluation; transformation zone component PRESENT.   Diagnosis      - Negative for intraepithelial lesion or malignancy (NILM)      Assessment & Plan:   Problem List Items Addressed This Visit       Other   Weight gain -  Primary    Chronic. Patient was not able to tolerate Contrave due to the side effects.  Will change medication to saxenda.  Discussed side effects and benefits of medication during visit. Discussed how to properly use medication.  Follow up in 1 month for reevaluation.       Other Visit Diagnoses     Need for influenza vaccination        Relevant Orders   Flu Vaccine QUAD 76moIM (Fluarix, Fluzone & Alfiuria Quad PF)        Follow up plan: Return in 5 weeks (on 04/21/2021), or if symptoms worsen or fail to improve, for Weight Managment.

## 2021-03-17 NOTE — Assessment & Plan Note (Signed)
Chronic. Patient was not able to tolerate Contrave due to the side effects.  Will change medication to saxenda.  Discussed side effects and benefits of medication during visit. Discussed how to properly use medication.  Follow up in 1 month for reevaluation.

## 2021-03-30 ENCOUNTER — Encounter: Payer: Self-pay | Admitting: Nurse Practitioner

## 2021-03-31 NOTE — Telephone Encounter (Signed)
Copied from CRM [id number]. Topic: General - Other >> Mar 31, 2021 12:25 PM Jaquita Rector A wrote: Reason for CRM: Patient called in to inform Larae Grooms that she would still like to be able to get the [place] but her insurance closed the Prior auth since they could not get the information they were seeking please call Ph# [phone]

## 2021-04-01 NOTE — Telephone Encounter (Signed)
Can you please check on this PA and make sure it states that she tried contrave and it did not work for her?

## 2021-04-14 ENCOUNTER — Encounter: Payer: Self-pay | Admitting: Nurse Practitioner

## 2021-04-14 MED ORDER — CONTRAVE 8-90 MG PO TB12: ORAL_TABLET | ORAL | 0 refills | Status: DC

## 2021-04-16 ENCOUNTER — Ambulatory Visit: Admitting: Nurse Practitioner

## 2021-04-21 ENCOUNTER — Ambulatory Visit: Admitting: Nurse Practitioner

## 2021-05-06 ENCOUNTER — Ambulatory Visit: Admitting: Nurse Practitioner

## 2021-05-12 ENCOUNTER — Ambulatory Visit (INDEPENDENT_AMBULATORY_CARE_PROVIDER_SITE_OTHER): Admitting: Nurse Practitioner

## 2021-05-12 ENCOUNTER — Encounter: Payer: Self-pay | Admitting: Nurse Practitioner

## 2021-05-12 ENCOUNTER — Other Ambulatory Visit: Payer: Self-pay

## 2021-05-12 VITALS — BP 119/67 | HR 81 | Temp 98.7°F | Wt 213.8 lb

## 2021-05-12 DIAGNOSIS — F339 Major depressive disorder, recurrent, unspecified: Secondary | ICD-10-CM | POA: Diagnosis not present

## 2021-05-12 DIAGNOSIS — R0683 Snoring: Secondary | ICD-10-CM

## 2021-05-12 DIAGNOSIS — R635 Abnormal weight gain: Secondary | ICD-10-CM | POA: Diagnosis not present

## 2021-05-12 MED ORDER — BUPROPION HCL ER (SR) 100 MG PO TB12: 100.0000 mg | ORAL_TABLET | Freq: Two times a day (BID) | ORAL | 0 refills | Status: DC

## 2021-05-12 NOTE — Assessment & Plan Note (Signed)
Chronic. Depression screen is elevated at visit today. Will give Wellbutrin 100mg  daily to help with depression as well as curb cravings. Side effects and benefits of medication discussed during visit.  Will follow up in 1 month for reevaluation.

## 2021-05-12 NOTE — Assessment & Plan Note (Signed)
Will place referral for medical weight loss. Will also start Wellbutrin to help with cravings.  Will follow up in 1 month for reevaluation.

## 2021-05-12 NOTE — Progress Notes (Signed)
BP 119/67    Pulse 81    Temp 98.7 F (37.1 C) (Oral)    Wt 213 lb 12.8 oz (97 kg)    LMP 04/19/2021    SpO2 97%    BMI 38.48 kg/m    Subjective:    Patient ID: Terri Wade, female    DOB: [birth year], 26 y.o.   MRN: [id number]  HPI: Terri Wade is a 26 y.o. female  Chief Complaint  Patient presents with   Snoring    Pt states she has been snoring a lot lately, states it has gone on for a few months. States she has also noticed herself stop breathing for a few seconds. Also notices swelling in her tonsils with occasional stones.    SNORING Pt states she has been snoring a lot lately, states it has gone on for a few months. States she has also noticed herself stop breathing for a few seconds. Also notices swelling in her tonsils with occasional stones.  STOP BANG SCORE - 4/8  DEPRESSION Patient states she feels like she is at a loss because she stopped taking the contrave and her depression revolves around her weight. She wasn't able to tolerate the contrave and has since gained weight.   [place] Office Visit from 05/12/2021 in [place]  PHQ-9 Total Score 10       GAD 7 : Generalized Anxiety Score 05/12/2021  Nervous, Anxious, on Edge 1  Control/stop worrying 0  Worry too much - different things 0  Trouble relaxing 1  Restless 0  Easily annoyed or irritable 2  Afraid - awful might happen 0  Total GAD 7 Score 4  Anxiety Difficulty Somewhat difficult     Denies HA, CP, SOB, dizziness, palpitations, visual changes, and lower extremity swelling.  Relevant past medical, surgical, family and social history reviewed and updated as indicated. Interim medical history since our last visit reviewed. Allergies and medications reviewed and updated.  Review of Systems  Eyes:  Negative for visual disturbance.  Respiratory:  Negative for cough, chest tightness and shortness of breath.   Cardiovascular:  Negative for chest pain, palpitations and  leg swelling.  Neurological:  Negative for dizziness and headaches.  Psychiatric/Behavioral:  Positive for dysphoric mood. Negative for suicidal ideas. The patient is nervous/anxious.    Per HPI unless specifically indicated above     Objective:    BP 119/67    Pulse 81    Temp 98.7 F (37.1 C) (Oral)    Wt 213 lb 12.8 oz (97 kg)    LMP 04/19/2021    SpO2 97%    BMI 38.48 kg/m   Wt Readings from Last 3 Encounters:  05/12/21 213 lb 12.8 oz (97 kg)  03/17/21 209 lb 12.8 oz (95.2 kg)  02/03/21 207 lb (93.9 kg)    Physical Exam Vitals and nursing note reviewed.  Constitutional:      General: She is not in acute distress.    Appearance: Normal appearance. She is normal weight. She is not ill-appearing, toxic-appearing or diaphoretic.  HENT:     Head: Normocephalic.     Right Ear: External ear normal.     Left Ear: External ear normal.     Nose: Nose normal.     Mouth/Throat:     Mouth: Mucous membranes are moist.     Pharynx: Oropharynx is clear.  Eyes:     General:        Right eye: No discharge.  Left eye: No discharge.     Extraocular Movements: Extraocular movements intact.     Conjunctiva/sclera: Conjunctivae normal.     Pupils: Pupils are equal, round, and reactive to light.  Cardiovascular:     Rate and Rhythm: Normal rate and regular rhythm.     Heart sounds: No murmur heard. Pulmonary:     Effort: Pulmonary effort is normal. No respiratory distress.     Breath sounds: Normal breath sounds. No wheezing or rales.  Musculoskeletal:     Cervical back: Normal range of motion and neck supple.  Skin:    General: Skin is warm and dry.     Capillary Refill: Capillary refill takes less than 2 seconds.  Neurological:     General: No focal deficit present.     Mental Status: She is alert and oriented to person, place, and time. Mental status is at baseline.  Psychiatric:        Mood and Affect: Mood normal.        Behavior: Behavior normal.        Thought Content:  Thought content normal.        Judgment: Judgment normal.    Results for orders placed or performed in visit on 02/03/21  CBC with Differential/Platelet  Result Value Ref Range   WBC 9.0 3.4 - 10.8 x10E3/uL   RBC 5.34 (H) 3.77 - 5.28 x10E6/uL   Hemoglobin 12.8 11.1 - 15.9 g/dL   Hematocrit 40.1 34.0 - 46.6 %   MCV 75 (L) 79 - 97 fL   MCH 24.0 (L) 26.6 - 33.0 pg   MCHC 31.9 31.5 - 35.7 g/dL   RDW 14.1 11.7 - 15.4 %   Platelets 348 150 - 450 x10E3/uL   Neutrophils 69 Not Estab. %   Lymphs 24 Not Estab. %   Monocytes 5 Not Estab. %   Eos 1 Not Estab. %   Basos 1 Not Estab. %   Neutrophils Absolute 6.2 1.4 - 7.0 x10E3/uL   Lymphocytes Absolute 2.2 0.7 - 3.1 x10E3/uL   Monocytes Absolute 0.5 0.1 - 0.9 x10E3/uL   EOS (ABSOLUTE) 0.1 0.0 - 0.4 x10E3/uL   Basophils Absolute 0.1 0.0 - 0.2 x10E3/uL   Immature Granulocytes 0 Not Estab. %   Immature Grans (Abs) 0.0 0.0 - 0.1 x10E3/uL  Comprehensive metabolic panel  Result Value Ref Range   Glucose 90 70 - 99 mg/dL   BUN 10 6 - 20 mg/dL   Creatinine, Ser 0.71 0.57 - 1.00 mg/dL   eGFR 122 >59 mL/min/1.73   BUN/Creatinine Ratio 14 9 - 23   Sodium 138 134 - 144 mmol/L   Potassium 4.2 3.5 - 5.2 mmol/L   Chloride 100 96 - 106 mmol/L   CO2 22 20 - 29 mmol/L   Calcium 9.5 8.7 - 10.2 mg/dL   Total Protein 7.8 6.0 - 8.5 g/dL   Albumin 5.0 3.9 - 5.0 g/dL   Globulin, Total 2.8 1.5 - 4.5 g/dL   Albumin/Globulin Ratio 1.8 1.2 - 2.2   Bilirubin Total 0.3 0.0 - 1.2 mg/dL   Alkaline Phosphatase 106 44 - 121 IU/L   AST 14 0 - 40 IU/L   ALT 9 0 - 32 IU/L  Lipid panel  Result Value Ref Range   Cholesterol, Total 134 100 - 199 mg/dL   Triglycerides 71 0 - 149 mg/dL   HDL 46 >39 mg/dL   VLDL Cholesterol Cal 14 5 - 40 mg/dL   LDL Chol Calc (NIH) 74 0 -  99 mg/dL   Chol/HDL Ratio 2.9 0.0 - 4.4 ratio  TSH  Result Value Ref Range   TSH 2.820 0.450 - 4.500 uIU/mL  Urinalysis, Routine w reflex microscopic  Result Value Ref Range   Specific  Gravity, UA 1.025 1.005 - 1.030   pH, UA 5.5 5.0 - 7.5   Color, UA Yellow Yellow   Appearance Ur Cloudy (A) Clear   Leukocytes,UA Negative Negative   Protein,UA Negative Negative/Trace   Glucose, UA Negative Negative   Ketones, UA Negative Negative   RBC, UA Negative Negative   Bilirubin, UA Negative Negative   Urobilinogen, Ur 1.0 0.2 - 1.0 mg/dL   Nitrite, UA Negative Negative  Cytology - PAP  Result Value Ref Range   Adequacy      Satisfactory for evaluation; transformation zone component PRESENT.   Diagnosis      - Negative for intraepithelial lesion or malignancy (NILM)      Assessment & Plan:   Problem List Items Addressed This Visit       Other   Depression, recurrent (Advance) - Primary    Chronic. Depression screen is elevated at visit today. Will give Wellbutrin 176m daily to help with depression as well as curb cravings. Side effects and benefits of medication discussed during visit.  Will follow up in 1 month for reevaluation.       Relevant Medications   buPROPion ER (WELLBUTRIN SR) 100 MG 12 hr tablet   Weight gain    Will place referral for medical weight loss. Will also start Wellbutrin to help with cravings.  Will follow up in 1 month for reevaluation.      Other Visit Diagnoses     Snoring       Referral placed for Sleep Study. STOP BANG SCORE 4/8 in office today. Will make further recommendations based on results.   Relevant Orders   Ambulatory referral to Sleep Studies        Follow up plan: Return in about 1 month (around 06/12/2021) for Depression/Anxiety FU.

## 2021-05-16 ENCOUNTER — Ambulatory Visit: Admitting: Nurse Practitioner

## 2021-06-04 ENCOUNTER — Ambulatory Visit: Admitting: Nurse Practitioner

## 2021-06-05 ENCOUNTER — Encounter: Payer: Self-pay | Admitting: Nurse Practitioner

## 2021-06-05 ENCOUNTER — Other Ambulatory Visit: Payer: Self-pay

## 2021-06-05 ENCOUNTER — Ambulatory Visit (INDEPENDENT_AMBULATORY_CARE_PROVIDER_SITE_OTHER): Admitting: Nurse Practitioner

## 2021-06-05 VITALS — BP 109/74 | HR 85 | Temp 97.8°F | Wt 215.2 lb

## 2021-06-05 DIAGNOSIS — Z23 Encounter for immunization: Secondary | ICD-10-CM

## 2021-06-05 DIAGNOSIS — R635 Abnormal weight gain: Secondary | ICD-10-CM | POA: Diagnosis not present

## 2021-06-05 DIAGNOSIS — Z021 Encounter for pre-employment examination: Secondary | ICD-10-CM | POA: Diagnosis not present

## 2021-06-05 NOTE — Progress Notes (Signed)
BP 109/74    Pulse 85    Temp 97.8 F (36.6 C) (Oral)    Wt 215 lb 3.2 oz (97.6 kg)    SpO2 98%    BMI 38.73 kg/m    Subjective:    Patient ID: Terri Wade, female    DOB: 1995/06/22, 26 y.o.   MRN: [id number]  HPI: Terri Wade is a 26 y.o. female  Chief Complaint  Patient presents with   Weight Check    Follow up per patient- states she stopped taking the Bupropion because it was changing her mood    Patient states she was taking the Wellbutrin but it was messing with her mood. She felt like she was responding very slowly to people and didn't like the way it was making her feel.    Relevant past medical, surgical, family and social history reviewed and updated as indicated. Interim medical history since our last visit reviewed. Allergies and medications reviewed and updated.  Review of Systems  Constitutional:  Positive for unexpected weight change.   Per HPI unless specifically indicated above     Objective:    BP 109/74    Pulse 85    Temp 97.8 F (36.6 C) (Oral)    Wt 215 lb 3.2 oz (97.6 kg)    SpO2 98%    BMI 38.73 kg/m   Wt Readings from Last 3 Encounters:  06/05/21 215 lb 3.2 oz (97.6 kg)  05/12/21 213 lb 12.8 oz (97 kg)  03/17/21 209 lb 12.8 oz (95.2 kg)    Physical Exam Vitals and nursing note reviewed.  Constitutional:      General: She is not in acute distress.    Appearance: Normal appearance. She is normal weight. She is not ill-appearing, toxic-appearing or diaphoretic.  HENT:     Head: Normocephalic.     Right Ear: External ear normal.     Left Ear: External ear normal.     Nose: Nose normal.     Mouth/Throat:     Mouth: Mucous membranes are moist.     Pharynx: Oropharynx is clear.  Eyes:     General:        Right eye: No discharge.        Left eye: No discharge.     Extraocular Movements: Extraocular movements intact.     Conjunctiva/sclera: Conjunctivae normal.     Pupils: Pupils are equal, round, and reactive to light.   Cardiovascular:     Rate and Rhythm: Normal rate and regular rhythm.     Heart sounds: No murmur heard. Pulmonary:     Effort: Pulmonary effort is normal. No respiratory distress.     Breath sounds: Normal breath sounds. No wheezing or rales.  Musculoskeletal:     Cervical back: Normal range of motion and neck supple.  Skin:    General: Skin is warm and dry.     Capillary Refill: Capillary refill takes less than 2 seconds.  Neurological:     General: No focal deficit present.     Mental Status: She is alert and oriented to person, place, and time. Mental status is at baseline.  Psychiatric:        Mood and Affect: Mood normal.        Behavior: Behavior normal.        Thought Content: Thought content normal.        Judgment: Judgment normal.    Results for orders placed or performed in visit on 02/03/21  CBC with Differential/Platelet  Result Value Ref Range   WBC 9.0 3.4 - 10.8 x10E3/uL   RBC 5.34 (H) 3.77 - 5.28 x10E6/uL   Hemoglobin 12.8 11.1 - 15.9 g/dL   Hematocrit 40.1 34.0 - 46.6 %   MCV 75 (L) 79 - 97 fL   MCH 24.0 (L) 26.6 - 33.0 pg   MCHC 31.9 31.5 - 35.7 g/dL   RDW 14.1 11.7 - 15.4 %   Platelets 348 150 - 450 x10E3/uL   Neutrophils 69 Not Estab. %   Lymphs 24 Not Estab. %   Monocytes 5 Not Estab. %   Eos 1 Not Estab. %   Basos 1 Not Estab. %   Neutrophils Absolute 6.2 1.4 - 7.0 x10E3/uL   Lymphocytes Absolute 2.2 0.7 - 3.1 x10E3/uL   Monocytes Absolute 0.5 0.1 - 0.9 x10E3/uL   EOS (ABSOLUTE) 0.1 0.0 - 0.4 x10E3/uL   Basophils Absolute 0.1 0.0 - 0.2 x10E3/uL   Immature Granulocytes 0 Not Estab. %   Immature Grans (Abs) 0.0 0.0 - 0.1 x10E3/uL  Comprehensive metabolic panel  Result Value Ref Range   Glucose 90 70 - 99 mg/dL   BUN 10 6 - 20 mg/dL   Creatinine, Ser 0.71 0.57 - 1.00 mg/dL   eGFR 122 >59 mL/min/1.73   BUN/Creatinine Ratio 14 9 - 23   Sodium 138 134 - 144 mmol/L   Potassium 4.2 3.5 - 5.2 mmol/L   Chloride 100 96 - 106 mmol/L   CO2 22 20 - 29  mmol/L   Calcium 9.5 8.7 - 10.2 mg/dL   Total Protein 7.8 6.0 - 8.5 g/dL   Albumin 5.0 3.9 - 5.0 g/dL   Globulin, Total 2.8 1.5 - 4.5 g/dL   Albumin/Globulin Ratio 1.8 1.2 - 2.2   Bilirubin Total 0.3 0.0 - 1.2 mg/dL   Alkaline Phosphatase 106 44 - 121 IU/L   AST 14 0 - 40 IU/L   ALT 9 0 - 32 IU/L  Lipid panel  Result Value Ref Range   Cholesterol, Total 134 100 - 199 mg/dL   Triglycerides 71 0 - 149 mg/dL   HDL 46 >39 mg/dL   VLDL Cholesterol Cal 14 5 - 40 mg/dL   LDL Chol Calc (NIH) 74 0 - 99 mg/dL   Chol/HDL Ratio 2.9 0.0 - 4.4 ratio  TSH  Result Value Ref Range   TSH 2.820 0.450 - 4.500 uIU/mL  Urinalysis, Routine w reflex microscopic  Result Value Ref Range   Specific Gravity, UA 1.025 1.005 - 1.030   pH, UA 5.5 5.0 - 7.5   Color, UA Yellow Yellow   Appearance Ur Cloudy (A) Clear   Leukocytes,UA Negative Negative   Protein,UA Negative Negative/Trace   Glucose, UA Negative Negative   Ketones, UA Negative Negative   RBC, UA Negative Negative   Bilirubin, UA Negative Negative   Urobilinogen, Ur 1.0 0.2 - 1.0 mg/dL   Nitrite, UA Negative Negative  Cytology - PAP  Result Value Ref Range   Adequacy      Satisfactory for evaluation; transformation zone component PRESENT.   Diagnosis      - Negative for intraepithelial lesion or malignancy (NILM)      Assessment & Plan:   Problem List Items Addressed This Visit       Other   Weight gain    Has restarted weight watchers.  States she couldn't tolerate the Wellbutrin either. Recommend sticking with [employer] for 3 months. If not successful will send patient to  medical weight loss.  Patient understands and agrees with the plan of care.       Other Visit Diagnoses     Pre-employment examination    -  Primary   Labs drawn during visit. Will fill out form once labs are back.   Relevant Orders   QuantiFERON-TB Gold Plus   Hepatitis B surface antibody,quantitative   Hepatitis B Surface AntiGEN   Measles/Mumps/Rubella  Immunity        Follow up plan: Return if symptoms worsen or fail to improve.

## 2021-06-05 NOTE — Assessment & Plan Note (Signed)
Has restarted weight watchers.  States she couldn't tolerate the Wellbutrin either. Recommend sticking with [employer] for 3 months. If not successful will send patient to medical weight loss.  Patient understands and agrees with the plan of care.

## 2021-06-09 ENCOUNTER — Encounter: Payer: Self-pay | Admitting: Nurse Practitioner

## 2021-06-10 LAB — MEASLES/MUMPS/RUBELLA IMMUNITY
MUMPS ABS, IGG: 80.9 AU/mL (ref >10.9)
RUBEOLA AB, IGG: 79.6 AU/mL (ref >16.4)
Rubella Antibodies, IGG: 4.4 index (ref >0.99)

## 2021-06-10 LAB — HEPATITIS B SURFACE ANTIGEN: Hepatitis B Surface Ag: NEGATIVE

## 2021-06-10 LAB — HEPATITIS B SURFACE ANTIBODY, QUANTITATIVE: Hepatitis B Surf Ab Quant: <3.1 m[IU]/mL — ABNORMAL LOW (ref >9.9)

## 2021-06-10 LAB — QUANTIFERON-TB GOLD PLUS
QuantiFERON Mitogen Value: >10 IU/mL
QuantiFERON Nil Value: 0.02 IU/mL
QuantiFERON TB1 Ag Value: 0.03 IU/mL
QuantiFERON TB2 Ag Value: 0.03 IU/mL
QuantiFERON-TB Gold Plus: NEGATIVE

## 2021-06-10 NOTE — Progress Notes (Signed)
Please let patient know that her tuberculosis test is negative.  She will need a Hep B vaccine booster to be compliant.  I will place the order for a nurse visit.  I will fill out the form once she gets the vaccine.

## 2021-06-10 NOTE — Addendum Note (Signed)
Addended by: Larae Grooms on: 06/10/2021 08:36 AM   Modules accepted: Orders

## 2021-06-12 ENCOUNTER — Other Ambulatory Visit: Payer: Self-pay

## 2021-06-12 ENCOUNTER — Ambulatory Visit: Admitting: Nurse Practitioner

## 2021-06-12 ENCOUNTER — Ambulatory Visit (INDEPENDENT_AMBULATORY_CARE_PROVIDER_SITE_OTHER)

## 2021-06-12 DIAGNOSIS — Z23 Encounter for immunization: Secondary | ICD-10-CM | POA: Diagnosis not present

## 2021-07-17 ENCOUNTER — Ambulatory Visit: Admitting: Nurse Practitioner

## 2021-07-23 ENCOUNTER — Ambulatory Visit (INDEPENDENT_AMBULATORY_CARE_PROVIDER_SITE_OTHER): Admitting: Neurology

## 2021-07-23 ENCOUNTER — Encounter: Payer: Self-pay | Admitting: Nurse Practitioner

## 2021-07-23 ENCOUNTER — Other Ambulatory Visit: Payer: Self-pay

## 2021-07-23 ENCOUNTER — Ambulatory Visit (INDEPENDENT_AMBULATORY_CARE_PROVIDER_SITE_OTHER): Admitting: Nurse Practitioner

## 2021-07-23 ENCOUNTER — Encounter: Payer: Self-pay | Admitting: Neurology

## 2021-07-23 ENCOUNTER — Ambulatory Visit: Admitting: Nurse Practitioner

## 2021-07-23 VITALS — BP 114/75 | HR 74 | Temp 98.2°F | Wt 214.2 lb

## 2021-07-23 VITALS — BP 105/66 | HR 69 | Ht 63.0 in | Wt 217.4 lb

## 2021-07-23 DIAGNOSIS — R0681 Apnea, not elsewhere classified: Secondary | ICD-10-CM | POA: Diagnosis not present

## 2021-07-23 DIAGNOSIS — J302 Other seasonal allergic rhinitis: Secondary | ICD-10-CM

## 2021-07-23 DIAGNOSIS — Z820 Family history of epilepsy and other diseases of the nervous system: Secondary | ICD-10-CM

## 2021-07-23 DIAGNOSIS — E669 Obesity, unspecified: Secondary | ICD-10-CM

## 2021-07-23 DIAGNOSIS — R0683 Snoring: Secondary | ICD-10-CM

## 2021-07-23 DIAGNOSIS — G4719 Other hypersomnia: Secondary | ICD-10-CM | POA: Diagnosis not present

## 2021-07-23 DIAGNOSIS — R519 Headache, unspecified: Secondary | ICD-10-CM

## 2021-07-23 NOTE — Patient Instructions (Signed)

## 2021-07-23 NOTE — Progress Notes (Signed)
Subjective:  ?  ?Patient ID: Terri Wade is a 26 y.o. female. ? ?HPI ? ? ? ?Huston Foley, MD, PhD ?[facility] ?[address], [address] ?[phone] ?[place], [place] [address] ? ?Dear Terri Wade,  ? ?I saw your patient, Terri Wade, requested my sleep clinic today for initial consultation of her sleep disorder, in particular, concern for underlying obstructive sleep apnea.  The patient is unaccompanied today.  As you know, [name] is a 26 year old right-handed woman with an underlying medical history of anxiety, depression, urinary tract infection, kidney stones, tonsillar stones, allergies, and obesity, who reports snoring and excessive daytime somnolence.  She has had weight gain over the past few years especially since having children.  She has had trouble losing weight.  She has been referred to medical weight management and has an appointment coming up soon.  She reports particular difficulty losing weight after her second child.  She has a 97-year-old and a 87-year-old.  She lives with her husband who has also noticed occasional pauses in her breathing while asleep and she has woken up on a couple of occasions with a sense of gasping for air.  She falls asleep quickly, generally in bed between 9 PM and rise time is at 5:30 AM.  She works as an Music therapist.  She is a non-smoker and does not utilize alcohol, limits her caffeine to 1 large cup of coffee per day on average.  There is a TV in her bedroom but she does not have it on all night or currently on before falling asleep.  She has no night to night nocturia but has occasionally woken up with a headache.  She reports a family history of sleep apnea affecting her maternal grandfather who has a CPAP machine.  She attributes some of her morning headaches to strain on her eyes, she has not had a recheck on her prescription eyeglasses in over 2 years and just made an appointment for follow-up.  She has 2  cats and 1 dog in the household.  She has an Epworth sleepiness score of 14 out of 24, fatigue severity score is 52 out of 63.  I reviewed your office note from 05/12/2021.  She has recently started sertraline low-dose which has been helpful.  She stopped taking Wellbutrin due to side effects. ? ? ?Her Past Medical History Is Significant For: ?Past Medical History:  ?Diagnosis Date  ? Allergy   ? Amoxicillin  ? Anxiety   ? Depression, recurrent (HCC) 12/23/2020  ? Hx: UTI (urinary tract infection)   ? Kidney stones   ? ? ?Her Past Surgical History Is Significant For: ?Past Surgical History:  ?Procedure Laterality Date  ? CESAREAN SECTION    ? ? ?Her Family History Is Significant For: ?Family History  ?Problem Relation Age of Onset  ? Healthy Mother   ? Anxiety disorder Mother   ? Depression Mother   ? Miscarriages / [place] Mother   ? Obesity Mother   ? Healthy Father   ? Cancer Maternal Grandmother   ? Early death Maternal Grandmother   ? Sleep apnea Neg Hx   ? ? ?Her Social History Is Significant For: ?Social History  ? ?Socioeconomic History  ? Marital status: Married  ?  Spouse name: Not on file  ? Number of children: Not on file  ? Years of education: Not on file  ? Highest education level: Not on file  ?Occupational History  ? Not on file  ?  Tobacco Use  ? Smoking status: Never  ? Smokeless tobacco: Never  ?Vaping Use  ? Vaping Use: Never used  ?Substance and Sexual Activity  ? Alcohol use: No  ? Drug use: No  ? Sexual activity: Yes  ?  Birth control/protection: I.U.D.  ?Other Topics Concern  ? Not on file  ?Social History Narrative  ? Not on file  ? ?Social Determinants of Health  ? ?Financial Resource Strain: Not on file  ?Food Insecurity: Not on file  ?Transportation Needs: Not on file  ?Physical Activity: Not on file  ?Stress: Not on file  ?Social Connections: Not on file  ? ? ?Her Allergies Are:  ?Allergies  ?Allergen Reactions  ? Amoxicillin Hives  ?:  ? ?Her Current Medications Are:  ?Outpatient  Encounter Medications as of 07/23/2021  ?Medication Sig  ? levonorgestrel (MIRENA) 20 MCG/DAY IUD 1 each by Intrauterine route once.  ? sertraline (ZOLOFT) 25 MG tablet   ? buPROPion ER (WELLBUTRIN SR) 100 MG 12 hr tablet Take 1 tablet (100 mg total) by mouth 2 (two) times daily.  ? ?No facility-administered encounter medications on file as of 07/23/2021.  ?: ? ? ?Review of Systems:  ?Out of a complete 14 point review of systems, all are reviewed and negative with the exception of these symptoms as listed below:  ? ?Review of Systems  ?Neurological:   ?     Pt here for sleep consult Pt states she snores, have fatigue,headache . Pt denies sleep study,CPAP machine ,and hypertension  ? ?ESS:14 ?FSS:52  ? ?Objective:  ?Neurological Exam ? ?Physical Exam ?Physical Examination:  ? ?Vitals:  ? 07/23/21 1023  ?BP: 105/66  ?Pulse: 69  ? ? ?General Examination: The patient is a very pleasant 26 y.o. female in no acute distress. She appears well-developed and well-nourished and well groomed.  ? ?HEENT: Normocephalic, atraumatic, pupils are equal, round and reactive to light, extraocular tracking is good without limitation to gaze excursion or nystagmus noted. Hearing is grossly intact. Face is symmetric with normal facial animation. Speech is clear with no dysarthria noted. There is no hypophonia. There is no lip, neck/head, jaw or voice tremor. Neck is supple with full range of passive and active motion. There are no carotid bruits on auscultation. Oropharynx exam reveals: No significant mouth dryness, good dental hygiene, mild to moderate airway crowding secondary to tonsillar size of about 1-2+, mild cryptic in appearance, no tonsillar stones visualized.  She has a slightly wider uvula, Mallampati class I, neck circumference of 14-1/4 inches, mild overbite noted.  Tongue protrudes centrally and palate elevates symmetrically.   ? ?Chest: Clear to auscultation without wheezing, rhonchi or crackles noted. ? ?Heart: S1+S2+0,  regular and normal without murmurs, rubs or gallops noted.  ? ?Abdomen: Soft, non-tender and non-distended. ? ?Extremities: There is no pitting edema in the distal lower extremities bilaterally.  ? ?Skin: Warm and dry without trophic changes noted.  ? ?Musculoskeletal: exam reveals no obvious joint deformities, tenderness or joint swelling or erythema.  ? ?Neurologically:  ?Mental status: The patient is awake, alert and oriented in all 4 spheres. Her immediate and remote memory, attention, language skills and fund of knowledge are appropriate. There is no evidence of aphasia, agnosia, apraxia or anomia. Speech is clear with normal prosody and enunciation. Thought process is linear. Mood is normal and affect is normal.  ?Cranial nerves II - XII are as described above under HEENT exam.  ?Motor exam: Normal bulk, strength and tone is noted. There  is no tremor. Fine motor skills and coordination: grossly intact.  ?Cerebellar testing: No dysmetria or intention tremor. There is no truncal or gait ataxia.  ?Sensory exam: intact to light touch in the upper and lower extremities.  ?Gait, station and balance: She stands easily. No veering to one side is noted. No leaning to one side is noted. Posture is age-appropriate and stance is narrow based. Gait shows normal stride length and normal pace. No problems turning are noted.  ? ?Assessment and Plan:  ?In summary, Newman NickelsRosa Linda Leon-Riggs is a very pleasant 26 y.o.-year old female with an underlying medical history of anxiety, depression, urinary tract infection, kidney stones, tonsillar stones, allergies, and obesity, whose history and physical exam are concerning for obstructive sleep apnea (OSA). ?I had a long chat with the patient about my findings and the diagnosis of OSA, its prognosis and treatment options. We talked about medical treatments, surgical interventions and non-pharmacological approaches. I explained in particular the risks and ramifications of untreated  moderate to severe OSA, especially with respect to developing cardiovascular disease down the Road, including congestive heart failure, difficult to treat hypertension, cardiac arrhythmias, or stroke. Even type 2 dia

## 2021-07-23 NOTE — Progress Notes (Signed)
? ?BP 114/75   Pulse 74   Temp 98.2 ?F (36.8 ?C) (Oral)   Wt 214 lb 3.2 oz (97.2 kg)   SpO2 98%   BMI 37.94 kg/m?   ? ?Subjective:  ? ? Patient ID: Terri Wade, female    DOB: 1996-02-05, 26 y.o.   MRN: [id number] ? ?HPI: ?Terri Wade is a 26 y.o. female ? ?Chief Complaint  ?Patient presents with  ? Ear Problem  ?  Pt states she has been having bilateral ear pain since last week, states the pain is mainly in her right her. States she can her a "whoosing" sound in her R ear when she lays down.   ? ?EAR PAIN ?Patient states she was sick about a month ago.  The symptoms went away then came back.  States she feels like there was a swooshing sound in there.  Now both ears feel irritated.  ?Duration:  1 month ?Involved ear(s):  both but mostly her right ear ?Severity:   no pain   ?Quality:   itching and swooshing ?Fever: no ?Otorrhea: no ?Upper respiratory infection symptoms: yes ?Pruritus: yes ?Hearing loss: no ?Water immersion no ?Using Q-tips: yes ?Recurrent otitis media: no ?Status: stable ?Treatments attempted: none ? ?Relevant past medical, surgical, family and social history reviewed and updated as indicated. Interim medical history since our last visit reviewed. ?Allergies and medications reviewed and updated. ? ?Review of Systems  ?HENT:    ?     Swooshing and irritation in ears  ? ?Per HPI unless specifically indicated above ? ?   ?Objective:  ?  ?BP 114/75   Pulse 74   Temp 98.2 ?F (36.8 ?C) (Oral)   Wt 214 lb 3.2 oz (97.2 kg)   SpO2 98%   BMI 37.94 kg/m?   ?Wt Readings from Last 3 Encounters:  ?07/23/21 214 lb 3.2 oz (97.2 kg)  ?07/23/21 217 lb 6.4 oz (98.6 kg)  ?06/05/21 215 lb 3.2 oz (97.6 kg)  ?  ?Physical Exam ?Vitals and nursing note reviewed.  ?Constitutional:   ?   General: She is not in acute distress. ?   Appearance: Normal appearance. She is normal weight. She is not ill-appearing, toxic-appearing or diaphoretic.  ?HENT:  ?   Head: Normocephalic.  ?   Right Ear: Ear canal  and external ear normal. No tenderness. A middle ear effusion is present. There is no impacted cerumen. Tympanic membrane is not erythematous.  ?   Left Ear: Ear canal and external ear normal. No tenderness. A middle ear effusion is present. There is no impacted cerumen. Tympanic membrane is not erythematous.  ?   Nose: Nose normal.  ?   Mouth/Throat:  ?   Mouth: Mucous membranes are moist.  ?   Pharynx: Oropharynx is clear.  ?Eyes:  ?   General:     ?   Right eye: No discharge.     ?   Left eye: No discharge.  ?   Extraocular Movements: Extraocular movements intact.  ?   Conjunctiva/sclera: Conjunctivae normal.  ?   Pupils: Pupils are equal, round, and reactive to light.  ?Cardiovascular:  ?   Rate and Rhythm: Normal rate and regular rhythm.  ?   Heart sounds: No murmur heard. ?Pulmonary:  ?   Effort: Pulmonary effort is normal. No respiratory distress.  ?   Breath sounds: Normal breath sounds. No wheezing or rales.  ?Musculoskeletal:  ?   Cervical back: Normal range of motion and neck  supple.  ?Skin: ?   General: Skin is warm and dry.  ?   Capillary Refill: Capillary refill takes less than 2 seconds.  ?Neurological:  ?   General: No focal deficit present.  ?   Mental Status: She is alert and oriented to person, place, and time. Mental status is at baseline.  ?Psychiatric:     ?   Mood and Affect: Mood normal.     ?   Behavior: Behavior normal.     ?   Thought Content: Thought content normal.     ?   Judgment: Judgment normal.  ? ? ?Results for orders placed or performed in visit on 06/05/21  ?QuantiFERON-TB Gold Plus  ?Result Value Ref Range  ? QuantiFERON Incubation Incubation performed.   ? QuantiFERON Criteria Comment   ? QuantiFERON TB1 Ag Value 0.03 IU/mL  ? QuantiFERON TB2 Ag Value 0.03 IU/mL  ? QuantiFERON Nil Value 0.02 IU/mL  ? QuantiFERON Mitogen Value >10.00 IU/mL  ? QuantiFERON-TB Gold Plus Negative Negative  ?Hepatitis B surface antibody,quantitative  ?Result Value Ref Range  ? Hepatitis B Surf Ab  Quant <3.1 (L) Immunity>9.9 mIU/mL  ?Hepatitis B Surface AntiGEN  ?Result Value Ref Range  ? Hepatitis B Surface Ag Negative Negative  ?Measles/Mumps/Rubella Immunity  ?Result Value Ref Range  ? Rubella Antibodies, IGG 4.40 Immune >0.99 index  ? RUBEOLA AB, IGG 79.6 Immune >16.4 AU/mL  ? MUMPS ABS, IGG 80.9 Immune >10.9 AU/mL  ? ?   ?Assessment & Plan:  ? ?Problem List Items Addressed This Visit   ?None ?Visit Diagnoses   ? ? Seasonal allergies    -  Primary  ? Reviewed symptoms and likely cause. Recommend Zyrtec nightly for the next 3 nights. Do use Zyrtec D occassionally for exacerbated symptoms. FU if not improved.  ? ?  ?  ? ?Follow up plan: ?Return if symptoms worsen or fail to improve. ? ? ? ? ? ?

## 2021-08-10 ENCOUNTER — Encounter

## 2021-08-17 ENCOUNTER — Ambulatory Visit (INDEPENDENT_AMBULATORY_CARE_PROVIDER_SITE_OTHER): Admitting: Neurology

## 2021-08-17 DIAGNOSIS — R0683 Snoring: Secondary | ICD-10-CM | POA: Diagnosis not present

## 2021-08-17 DIAGNOSIS — G4720 Circadian rhythm sleep disorder, unspecified type: Secondary | ICD-10-CM

## 2021-08-17 DIAGNOSIS — R519 Headache, unspecified: Secondary | ICD-10-CM

## 2021-08-17 DIAGNOSIS — R0681 Apnea, not elsewhere classified: Secondary | ICD-10-CM

## 2021-08-17 DIAGNOSIS — Z820 Family history of epilepsy and other diseases of the nervous system: Secondary | ICD-10-CM

## 2021-08-17 DIAGNOSIS — E669 Obesity, unspecified: Secondary | ICD-10-CM

## 2021-08-17 DIAGNOSIS — G4719 Other hypersomnia: Secondary | ICD-10-CM

## 2021-08-27 NOTE — Procedures (Signed)
PATIENT'S NAME:  Terri, Wade ?DOB:      1995/11/04      ?MR#:    [id number]     ?DATE OF RECORDING: 08/17/2021 ?REFERRING M.D.:  Larae Grooms, NP ?Study Performed:   Baseline Polysomnogram ?HISTORY: 26 year old woman with a history of anxiety, depression, urinary tract infection, kidney stones, tonsillar stones, allergies, and obesity, who reports snoring and excessive daytime somnolence. The patient endorsed the Epworth Sleepiness Scale at 14 points. The patient's weight 217 pounds with a height of 63 (inches), resulting in a BMI of 38.3 kg/m2. The patient's neck circumference measured 14 1/4 inches. ? ?CURRENT MEDICATIONS: Mirena, Zoloft, Wellbutrin. ?  ?PROCEDURE:  This is a multichannel digital polysomnogram utilizing the Somnostar 11.2 system.  Electrodes and sensors were applied and monitored per AASM Specifications.   EEG, EOG, Chin and Limb EMG, were sampled at 200 Hz.  ECG, Snore and Nasal Pressure, Thermal Airflow, Respiratory Effort, CPAP Flow and Pressure, Oximetry was sampled at 50 Hz. Digital video and audio were recorded.     ? ?BASELINE STUDY ? ?Lights Out was at 20:36 and Lights On at 04:59.  Total recording time (TRT) was 497 minutes, with a total sleep time (TST) of 408.5 minutes.   The patient's sleep latency was 45 minutes, which is delayed. REM latency was 73.5 minutes, which is normal. The sleep efficiency was 82.2 %.  ?   ?SLEEP ARCHITECTURE: WASO (Wake after sleep onset) was 45.5 minutes with mild sleep fragmentation noted. There were 12.5 minutes in Stage N1, 217.5 minutes Stage N2, 108 minutes Stage N3 and 70.5 minutes in Stage REM.  The percentage of Stage N1 was 3.1%, Stage N2 was 53.2%, which is normal, Stage N3 was 26.4%, which is mildly increased, and Stage R (REM sleep) was 17.3%, which is mildly reduced. The arousals were noted as: 52 were spontaneous, 0 were associated with PLMs, 1 were associated with respiratory events. ? ?RESPIRATORY ANALYSIS:  There were a total of 8  respiratory events:  0 obstructive apneas, 0 central apneas and 0 mixed apneas with a total of 0 apneas and an apnea index (AI) of 0 /hour. There were 8 hypopneas with a hypopnea index of 1.2 /hour. The patient also had 0 respiratory event related arousals (RERAs).  ?    ?The total APNEA/HYPOPNEA INDEX (AHI) was 1.2/hour and the total RESPIRATORY DISTURBANCE INDEX was  1.2 /hour.  6 events occurred in REM sleep and 4 events in NREM. The REM AHI was  5.1 /hour, versus a non-REM AHI of .4. The patient spent 0 minutes of total sleep time in the supine position and 409 minutes in non-supine.. The supine AHI was 0.0 versus a non-supine AHI of 1.2. ? ?OXYGEN SATURATION & C02:  The Wake baseline 02 saturation was 96%, with the lowest being 89%. Time spent below 89% saturation equaled 0 minutes. ? ?PERIODIC LIMB MOVEMENTS:   ?The patient had a total of 29 Periodic Limb Movements.  The Periodic Limb Movement (PLM) index was 4.3 and the PLM Arousal index was 0/hour.  ? ?Audio and video analysis did not show any abnormal or unusual movements, behaviors, phonations or vocalizations. The patient took 1 bathroom break. Mild snoring was noted. The EKG was in keeping with normal sinus rhythm (NSR). ? ?Post-study, the patient indicated that sleep was the same as usual.  ? ?IMPRESSION: ? ?Primary Snoring ?Dysfunctions associated with sleep stages or arousal from sleep ? ?RECOMMENDATIONS: ? ?This study does not demonstrate any significant obstructive or central  sleep disordered breathing with the exception of mild snoring and borderline REM related sleep apnea. The absence of supine sleep may have resulted in some degree of underestimation of her sleep disordered breathing. Weight loss and avoiding the supine sleep position may result in reduction of her snoring and mild stage-related OSA. Treatment with a positive airway pressure device is not indicated.  ?This study shows mild sleep fragmentation and mildly abnormal sleep stage  percentages; these are nonspecific findings and per se do not signify an intrinsic sleep disorder or a cause for the patient's sleep-related symptoms. Causes include (but are not limited to) the first night effect of the sleep study, circadian rhythm disturbances, medication effect or an underlying mood disorder or medical problem. ?The patient should be cautioned not to drive, work at heights, or operate dangerous or heavy equipment when tired or sleepy. Review and reiteration of good sleep hygiene measures should be pursued with any patient. ?The patient will be advised to follow up with the referring provider, who will be notified of the test results. ? ?I certify that I have reviewed the entire raw data recording prior to the issuance of this report in accordance with the Standards of Accreditation of the American Academy of Sleep Medicine (AASM) ? ?Huston Foley, MD, PhD ?Diplomat, American Board of Neurology and Sleep Medicine (Neurology and Sleep Medicine) ? ? ? ?

## 2021-08-28 ENCOUNTER — Telehealth: Payer: Self-pay | Admitting: *Deleted

## 2021-08-28 NOTE — Telephone Encounter (Signed)
-----   Message from Huston Foley, MD sent at 08/27/2021  6:36 PM EDT ----- ?Patient referred by PCP, seen by me on 07/23/21, diagnostic PSG on 08/17/21.   ?Please call and notify the patient that the recent sleep study did not show any significant obstructive sleep apnea with the exception of mild snoring and borderline REM related sleep apnea. The absence of supine sleep may have resulted in some degree of underestimation of her sleep disordered breathing. Weight loss and avoiding the supine sleep position may result in reduction of her snoring and mild stage-related OSA. Treatment with a positive airway pressure device is not indicated.  ?She can followup with PCP as scheduled/planned.  ?Thanks, ? ?Huston Foley, MD, PhD ?[facility]) ? ?

## 2021-08-28 NOTE — Telephone Encounter (Signed)
Called pt & LVM (ok per DPR) with sleep study results as noted below by Dr Frances Furbish. Left office number in message. Asked for call back to confirm receipt of message.  ?

## 2021-09-03 ENCOUNTER — Encounter: Payer: Self-pay | Admitting: Nurse Practitioner

## 2021-09-21 ENCOUNTER — Other Ambulatory Visit: Payer: Self-pay | Admitting: Nurse Practitioner

## 2021-09-22 ENCOUNTER — Encounter: Payer: Self-pay | Admitting: Nurse Practitioner

## 2021-10-07 ENCOUNTER — Encounter: Payer: Self-pay | Admitting: Nurse Practitioner

## 2021-10-07 ENCOUNTER — Ambulatory Visit: Admitting: Nurse Practitioner

## 2021-10-07 VITALS — BP 99/64 | HR 83 | Temp 98.1°F | Wt 210.6 lb

## 2021-10-07 DIAGNOSIS — K8020 Calculus of gallbladder without cholecystitis without obstruction: Secondary | ICD-10-CM | POA: Diagnosis not present

## 2021-10-07 DIAGNOSIS — F339 Major depressive disorder, recurrent, unspecified: Secondary | ICD-10-CM | POA: Diagnosis not present

## 2021-10-07 MED ORDER — SERTRALINE HCL 25 MG PO TABS: 25.0000 mg | ORAL_TABLET | Freq: Every day | ORAL | 1 refills | Status: DC

## 2021-10-07 NOTE — Progress Notes (Signed)
BP 99/64   Pulse 83   Temp 98.1 F (36.7 C) (Oral)   Wt 210 lb 9.6 oz (95.5 kg)   SpO2 97%   BMI 37.31 kg/m    Subjective:    Patient ID: Terri Wade, female    DOB: 1996/03/11, 26 y.o.   MRN: [id number]  HPI: Terri Wade is a 26 y.o. female  Chief Complaint  Patient presents with   Depression    DEPRESSION Patient states she feels like she is doing a lot better.  Patient states the Zoloft takes the edge off and makes things more manageable.    Flowsheet Row Office Visit from 10/07/2021 in [facility]  PHQ-9 Total Score 9          10/07/2021    3:33 PM 07/23/2021    1:20 PM 06/05/2021    3:42 PM 05/12/2021   10:56 AM  GAD 7 : Generalized Anxiety Score  Nervous, Anxious, on Edge 1 1 0 1  Control/stop worrying 1 1 0 0  Worry too much - different things 2 1 1  0  Trouble relaxing 2 1 1 1   Restless 1 0 1 0  Easily annoyed or irritable 1 1 1 2   Afraid - awful might happen 0 1 0 0  Total GAD 7 Score 8 6 4 4   Anxiety Difficulty Somewhat difficult Somewhat difficult Somewhat difficult Somewhat difficult   Patient states she has been having occasional abdominal pain into her back.  States it happened two nights ago and kept her up.  She has a history of gallstones  Relevant past medical, surgical, family and social history reviewed and updated as indicated. Interim medical history since our last visit reviewed. Allergies and medications reviewed and updated.  Review of Systems  Eyes:  Negative for visual disturbance.  Respiratory:  Negative for cough, chest tightness and shortness of breath.   Cardiovascular:  Negative for chest pain, palpitations and leg swelling.  Gastrointestinal:  Positive for abdominal pain.  Musculoskeletal:  Positive for back pain.  Neurological:  Negative for dizziness and headaches.  Psychiatric/Behavioral:  Positive for dysphoric mood. Negative for suicidal ideas. The patient is nervous/anxious.    Per HPI unless  specifically indicated above     Objective:    BP 99/64   Pulse 83   Temp 98.1 F (36.7 C) (Oral)   Wt 210 lb 9.6 oz (95.5 kg)   SpO2 97%   BMI 37.31 kg/m   Wt Readings from Last 3 Encounters:  10/07/21 210 lb 9.6 oz (95.5 kg)  07/23/21 214 lb 3.2 oz (97.2 kg)  07/23/21 217 lb 6.4 oz (98.6 kg)    Physical Exam Vitals and nursing note reviewed.  Constitutional:      General: She is not in acute distress.    Appearance: Normal appearance. She is normal weight. She is not ill-appearing, toxic-appearing or diaphoretic.  HENT:     Head: Normocephalic.     Right Ear: External ear normal.     Left Ear: External ear normal.     Nose: Nose normal.     Mouth/Throat:     Mouth: Mucous membranes are moist.     Pharynx: Oropharynx is clear.  Eyes:     General:        Right eye: No discharge.        Left eye: No discharge.     Extraocular Movements: Extraocular movements intact.     Conjunctiva/sclera: Conjunctivae normal.  Pupils: Pupils are equal, round, and reactive to light.  Cardiovascular:     Rate and Rhythm: Normal rate and regular rhythm.     Heart sounds: No murmur heard. Pulmonary:     Effort: Pulmonary effort is normal. No respiratory distress.     Breath sounds: Normal breath sounds. No wheezing or rales.  Abdominal:     General: Abdomen is flat. Bowel sounds are normal. There is no distension.     Palpations: Abdomen is soft.     Tenderness: There is no abdominal tenderness. There is no right CVA tenderness, left CVA tenderness or guarding.  Musculoskeletal:     Cervical back: Normal range of motion and neck supple.  Skin:    General: Skin is warm and dry.     Capillary Refill: Capillary refill takes less than 2 seconds.  Neurological:     General: No focal deficit present.     Mental Status: She is alert and oriented to person, place, and time. Mental status is at baseline.  Psychiatric:        Mood and Affect: Mood normal.        Behavior: Behavior  normal.        Thought Content: Thought content normal.        Judgment: Judgment normal.    Results for orders placed or performed in visit on 06/05/21  QuantiFERON-TB Gold Plus  Result Value Ref Range   QuantiFERON Incubation Incubation performed.    QuantiFERON Criteria Comment    QuantiFERON TB1 Ag Value 0.03 IU/mL   QuantiFERON TB2 Ag Value 0.03 IU/mL   QuantiFERON Nil Value 0.02 IU/mL   QuantiFERON Mitogen Value >10.00 IU/mL   QuantiFERON-TB Gold Plus Negative Negative  Hepatitis B surface antibody,quantitative  Result Value Ref Range   Hepatitis B Surf Ab Quant <3.1 (L) Immunity>9.9 mIU/mL  Hepatitis B Surface AntiGEN  Result Value Ref Range   Hepatitis B Surface Ag Negative Negative  Measles/Mumps/Rubella Immunity  Result Value Ref Range   Rubella Antibodies, IGG 4.40 Immune >0.99 index   RUBEOLA AB, IGG 79.6 Immune >16.4 AU/mL   MUMPS ABS, IGG 80.9 Immune >10.9 AU/mL      Assessment & Plan:   Problem List Items Addressed This Visit       Digestive   Gallstones    Will order [place] to evaluate for gallstones.  Will make recommendations based on imaging results.       Relevant Orders   US Abdomen Limited RUQ (LIVER/GB)     Other   Depression, recurrent (HCC) - Primary    Chronic.  Controlled.  Continue with current medication regimen of Zoloft 25mg  daily.   Return to clinic in 6 months for reevaluation.  Call sooner if concerns arise.         Relevant Medications   sertraline (ZOLOFT) 25 MG tablet     Follow up plan: Return in about 6 months (around 04/08/2022) for Physical and Fasting labs.

## 2021-10-07 NOTE — Assessment & Plan Note (Signed)
Chronic.  Controlled.  Continue with current medication regimen of Zoloft 25mg  daily.   Return to clinic in 6 months for reevaluation.  Call sooner if concerns arise.

## 2021-10-07 NOTE — Assessment & Plan Note (Signed)
Will order [place] to evaluate for gallstones.  Will make recommendations based on imaging results.

## 2021-10-09 ENCOUNTER — Encounter: Payer: Self-pay | Admitting: Nurse Practitioner

## 2021-10-09 ENCOUNTER — Ambulatory Visit
Admission: RE | Admit: 2021-10-09 | Discharge: 2021-10-09 | Disposition: A | Discharge status: Home / Self Care | Source: Ambulatory Visit | Attending: Nurse Practitioner | Admitting: Nurse Practitioner

## 2021-10-09 DIAGNOSIS — K8020 Calculus of gallbladder without cholecystitis without obstruction: Secondary | ICD-10-CM | POA: Insufficient documentation

## 2021-10-09 DIAGNOSIS — K760 Fatty (change of) liver, not elsewhere classified: Secondary | ICD-10-CM

## 2021-10-09 NOTE — Progress Notes (Signed)
Hi Terri Wade.  Your ultrasound shows that you have gallstones and a fatty liver.  I recommend you see a gastroenterologist.  If you agree, I will place the referral.

## 2021-10-10 ENCOUNTER — Ambulatory Visit: Admitting: Nurse Practitioner

## 2021-10-13 ENCOUNTER — Other Ambulatory Visit

## 2021-10-20 NOTE — Telephone Encounter (Signed)
Patient is asking for a referral to Gastroenterology in [place], [place] with [facility] [phone]) Dr. Teressa Lower, Kelton Pillar, MD

## 2021-11-18 ENCOUNTER — Telehealth: Payer: Self-pay

## 2021-11-18 NOTE — Telephone Encounter (Signed)
PT had called in to ck on below message as asked to fu by V[facility] Diamond at [facility]. Pt was doing fu and told that office has received message also.

## 2021-11-18 NOTE — Telephone Encounter (Signed)
Copied from CRM [id number]. Topic: Referral - Question >> Nov 18, 2021 12:21 PM Marlow Baars wrote: Reason for CRM: I had Lauraianne with [facility] calling regarding a referral for Gallstones. She indicated that Dr Festus Aloe is a vascular surgeon who doesn't do that type of surgery but there are four general surgeons there that do. Dr Sherri Sear who she works for does a lot of those surgeries. She would be happy to take that referral if the Abdominal Ultrasound can be sent over. She also tried to call the patient and left a Voice Mail for the patient to call her. The fax number is [phone] which is a virtual fax.

## 2021-11-19 NOTE — Telephone Encounter (Signed)
Please send over ultrasound report.  She shouldn't need a new referral if the patient is seeing someone in the same practice.

## 2021-11-19 NOTE — Telephone Encounter (Signed)
Faxed [place] report over to [phone]

## 2021-11-19 NOTE — Telephone Encounter (Signed)
Loryanne from [facility] stated she needs Ultrasound report .  Loryanne stated pt has an appointment this coming Monday.   Fax number is [phone]

## 2021-12-12 HISTORY — PX: CHOLECYSTECTOMY: SHX55

## 2022-04-06 ENCOUNTER — Ambulatory Visit: Admitting: Nurse Practitioner

## 2022-04-06 ENCOUNTER — Encounter: Payer: Self-pay | Admitting: Nurse Practitioner

## 2022-04-06 VITALS — BP 104/66 | HR 73 | Temp 98.5°F | Ht 63.0 in | Wt 204.0 lb

## 2022-04-06 DIAGNOSIS — Z6836 Body mass index (BMI) 36.0-36.9, adult: Secondary | ICD-10-CM

## 2022-04-06 DIAGNOSIS — Z23 Encounter for immunization: Secondary | ICD-10-CM | POA: Diagnosis not present

## 2022-04-06 DIAGNOSIS — Z6830 Body mass index (BMI) 30.0-30.9, adult: Secondary | ICD-10-CM | POA: Insufficient documentation

## 2022-04-06 DIAGNOSIS — Z136 Encounter for screening for cardiovascular disorders: Secondary | ICD-10-CM | POA: Diagnosis not present

## 2022-04-06 DIAGNOSIS — F339 Major depressive disorder, recurrent, unspecified: Secondary | ICD-10-CM

## 2022-04-06 DIAGNOSIS — N62 Hypertrophy of breast: Secondary | ICD-10-CM

## 2022-04-06 DIAGNOSIS — Z0000 Encounter for general adult medical examination without abnormal findings: Secondary | ICD-10-CM | POA: Diagnosis not present

## 2022-04-06 LAB — MICROSCOPIC EXAMINATION: Bacteria, UA: NONE SEEN

## 2022-04-06 LAB — URINALYSIS, ROUTINE W REFLEX MICROSCOPIC
Bilirubin, UA: NEGATIVE
Glucose, UA: NEGATIVE
Ketones, UA: NEGATIVE
Leukocytes,UA: NEGATIVE
Nitrite, UA: NEGATIVE
Protein,UA: NEGATIVE
Specific Gravity, UA: >1.03 — ABNORMAL HIGH (ref 1.005–1.030)
Urobilinogen, Ur: 0.2 mg/dL (ref 0.2–1.0)
pH, UA: 5.5 (ref 5.0–7.5)

## 2022-04-06 MED ORDER — METFORMIN HCL 500 MG PO TABS
500.0000 mg | ORAL_TABLET | Freq: Two times a day (BID) | ORAL | 1 refills | Status: DC
Start: 2022-04-06 — End: 2022-06-10

## 2022-04-06 MED ORDER — SERTRALINE HCL 25 MG PO TABS: 25.0000 mg | ORAL_TABLET | Freq: Every day | ORAL | 1 refills | Status: DC

## 2022-04-06 NOTE — Progress Notes (Signed)
BP 104/66   Pulse 73   Temp 98.5 F (36.9 C) (Oral)   Ht 5\' 3"  (1.6 m)   Wt 204 lb (92.5 kg)   SpO2 97%   BMI 36.14 kg/m    Subjective:    Patient ID: , female    DOB: 03/25/1996, 26 y.o.   MRN: [id number]  HPI: Terri Wade is a 26 y.o. female presenting on 04/06/2022 for comprehensive medical examination. Current medical complaints include:none  She currently lives with: Menopausal Symptoms: no  Denies HA, CP, SOB, dizziness, palpitations, visual changes, and lower extremity swelling.  BACK PAIN Patient states she is having back pain.  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  Her neck and back hurt at the end of the day.  She has indentations from her bra in her shoulders. She would like to try and get a breast reduction.    WEIGHT MANAGEMENT Patient is currently taking Metformin.  She was seeing a weight loss provider but no longer seeing them.   DEPRESSION Patient states her depression is going well.  She feels more anxious if she forgets to take the medication.  Denies SI.   Depression Screen done today and results listed below:     04/06/2022    2:12 PM 10/07/2021    3:33 PM 07/23/2021    1:20 PM 06/05/2021    3:42 PM 05/12/2021   10:56 AM  Depression screen PHQ 2/9  Decreased Interest 0 1 0 1 0  Down, Depressed, Hopeless 0 1 1 1  0  PHQ - 2 Score 0 2 1 2  0  Altered sleeping 0 1 2 1 3   Tired, decreased energy 2 2 2 3 3   Change in appetite 3 2 2 3 3   Feeling bad or failure about yourself  0 1 1 1 1   Trouble concentrating 0 1 1 0 0  Moving slowly or fidgety/restless 0 0 0 0 0  Suicidal thoughts 0 0 0 0 0  PHQ-9 Score 5 9 9 10 10   Difficult doing work/chores Somewhat difficult Somewhat difficult Somewhat difficult Somewhat difficult Somewhat difficult    The patient does not have a history of falls. I did complete a risk assessment for falls. A plan of care for falls was  documented.   Past Medical History:  Past Medical History:  Diagnosis Date   Allergy    Amoxicillin   Anxiety    Depression, recurrent (HCC) 12/23/2020   Hx: UTI (urinary tract infection)    Kidney stones     Surgical History:  Past Surgical History:  Procedure Laterality Date   CESAREAN SECTION     CHOLECYSTECTOMY  12/12/2021    Medications:  Current Outpatient Medications on File Prior to Visit  Medication Sig   levonorgestrel (MIRENA) 20 MCG/DAY IUD 1 each by Intrauterine route once.   No current facility-administered medications on file prior to visit.    Allergies:  Allergies  Allergen Reactions   Amoxicillin Hives    Social History:  Social History   Socioeconomic History   Marital status: Married    Spouse name: Not on file   Number of children: Not on file   Years of education: Not on file   Highest education level: Not on file  Occupational History   Not on file  Tobacco Use   Smoking status: Never   Smokeless tobacco: Never  Vaping Use  Vaping Use: Never used  Substance and Sexual Activity   Alcohol use: No   Drug use: No   Sexual activity: Yes    Birth control/protection: I.U.D.  Other Topics Concern   Not on file  Social History Narrative   Not on file   Social Determinants of Health   Financial Resource Strain: Not on file  Food Insecurity: Not on file  Transportation Needs: Not on file  Physical Activity: Not on file  Stress: Not on file  Social Connections: Not on file  Intimate Partner Violence: Not on file   Social History   Tobacco Use  Smoking Status Never  Smokeless Tobacco Never   Social History   Substance and Sexual Activity  Alcohol Use No    Family History:  Family History  Problem Relation Age of Onset   Healthy Mother    Anxiety disorder Mother    Depression Mother    Miscarriages / Stillbirths Mother    Obesity Mother    Healthy Father    Cancer Maternal Grandmother    Early death Maternal  Grandmother    Sleep apnea Neg Hx     Past medical history, surgical history, medications, allergies, family history and social history reviewed with patient today and changes made to appropriate areas of the chart.   Review of Systems  Eyes:  Negative for blurred vision and double vision.  Respiratory:  Negative for shortness of breath.   Cardiovascular:  Negative for chest pain, palpitations and leg swelling.  Neurological:  Negative for dizziness and headaches.  Psychiatric/Behavioral:  Positive for depression. Negative for suicidal ideas. The patient is nervous/anxious.    All other ROS negative except what is listed above and in the HPI.      Objective:    BP 104/66   Pulse 73   Temp 98.5 F (36.9 C) (Oral)   Ht 5\' 3"  (1.6 m)   Wt 204 lb (92.5 kg)   SpO2 97%   BMI 36.14 kg/m   Wt Readings from Last 3 Encounters:  04/06/22 204 lb (92.5 kg)  10/07/21 210 lb 9.6 oz (95.5 kg)  07/23/21 214 lb 3.2 oz (97.2 kg)    Physical Exam Vitals and nursing note reviewed. Exam conducted with a chaperone present Darrol Angel(Shannon Levens, CMA).  Constitutional:      General: She is awake. She is not in acute distress.    Appearance: Normal appearance. She is well-developed. She is obese. She is not ill-appearing.  HENT:     Head: Normocephalic and atraumatic.     Right Ear: Hearing, tympanic membrane, ear canal and external ear normal. No drainage.     Left Ear: Hearing, tympanic membrane, ear canal and external ear normal. No drainage.     Nose: Nose normal.     Right Sinus: No maxillary sinus tenderness or frontal sinus tenderness.     Left Sinus: No maxillary sinus tenderness or frontal sinus tenderness.     Mouth/Throat:     Mouth: Mucous membranes are moist.     Pharynx: Oropharynx is clear. Uvula midline. No pharyngeal swelling, oropharyngeal exudate or posterior oropharyngeal erythema.  Eyes:     General: Lids are normal.        Right eye: No discharge.        Left eye: No  discharge.     Extraocular Movements: Extraocular movements intact.     Conjunctiva/sclera: Conjunctivae normal.     Pupils: Pupils are equal, round, and reactive to light.  Visual Fields: Right eye visual fields normal and left eye visual fields normal.  Neck:     Thyroid: No thyromegaly.     Vascular: No carotid bruit.     Trachea: Trachea normal.  Cardiovascular:     Rate and Rhythm: Normal rate and regular rhythm.     Heart sounds: Normal heart sounds. No murmur heard.    No gallop.  Pulmonary:     Effort: Pulmonary effort is normal. No accessory muscle usage or respiratory distress.     Breath sounds: Normal breath sounds.  Chest:  Breasts:    Right: Normal.     Left: Normal.  Abdominal:     General: Bowel sounds are normal.     Palpations: Abdomen is soft. There is no hepatomegaly or splenomegaly.     Tenderness: There is no abdominal tenderness.  Genitourinary:    General: Normal vulva.     Vagina: Normal.     Cervix: Discharge present. No friability or erythema.     Adnexa: Right adnexa normal and left adnexa normal.  Musculoskeletal:        General: Normal range of motion.     Cervical back: Normal range of motion and neck supple.     Right lower leg: No edema.     Left lower leg: No edema.  Lymphadenopathy:     Head:     Right side of head: No submental, submandibular, tonsillar, preauricular or posterior auricular adenopathy.     Left side of head: No submental, submandibular, tonsillar, preauricular or posterior auricular adenopathy.     Cervical: No cervical adenopathy.     Upper Body:     Right upper body: No supraclavicular, axillary or pectoral adenopathy.     Left upper body: No supraclavicular, axillary or pectoral adenopathy.  Skin:    General: Skin is warm and dry.     Capillary Refill: Capillary refill takes less than 2 seconds.     Findings: No rash.  Neurological:     Mental Status: She is alert and oriented to person, place, and time.      Gait: Gait is intact.     Deep Tendon Reflexes: Reflexes are normal and symmetric.     Reflex Scores:      Brachioradialis reflexes are 2+ on the right side and 2+ on the left side.      Patellar reflexes are 2+ on the right side and 2+ on the left side. Psychiatric:        Attention and Perception: Attention normal.        Mood and Affect: Mood normal.        Speech: Speech normal.        Behavior: Behavior normal. Behavior is cooperative.        Thought Content: Thought content normal.        Judgment: Judgment normal.     Results for orders placed or performed in visit on 06/05/21  QuantiFERON-TB Gold Plus  Result Value Ref Range   QuantiFERON Incubation Incubation performed.    QuantiFERON Criteria Comment    QuantiFERON TB1 Ag Value 0.03 IU/mL   QuantiFERON TB2 Ag Value 0.03 IU/mL   QuantiFERON Nil Value 0.02 IU/mL   QuantiFERON Mitogen Value >10.00 IU/mL   QuantiFERON-TB Gold Plus Negative Negative  Hepatitis B surface antibody,quantitative  Result Value Ref Range   Hepatitis B Surf Ab Quant <3.1 (L) Immunity>9.9 mIU/mL  Hepatitis B Surface AntiGEN  Result Value Ref Range   Hepatitis B  Surface Ag Negative Negative  Measles/Mumps/Rubella Immunity  Result Value Ref Range   Rubella Antibodies, IGG 4.40 Immune >0.99 index   RUBEOLA AB, IGG 79.6 Immune >16.4 AU/mL   MUMPS ABS, IGG 80.9 Immune >10.9 AU/mL      Assessment & Plan:   Problem List Items Addressed This Visit       Other   Depression, recurrent (HCC)    Chronic.  Controlled.  Continue with current medication regimen of Zoloft 25mg  daily.  Refills sent today.  Labs ordered today.  Return to clinic in 6 months for reevaluation.  Call sooner if concerns arise.        Relevant Medications   sertraline (ZOLOFT) 25 MG tablet   BMI 36.0-36.9,adult    Recommended eating smaller high protein, low fat meals more frequently and exercising 30 mins a day 5 times a week with a goal of 10-15lb weight loss in the next 3  months. Patient voiced their understanding and motivation to adhere to these recommendations.       Other Visit Diagnoses     Annual physical exam    -  Primary   Health maintenance reviewed during visit today.  Labs ordered today.  Vaccines up to date. PAP up to date.   Relevant Orders   CBC with Differential/Platelet   Comprehensive metabolic panel   TSH   Urinalysis, Routine w reflex microscopic   Screening for ischemic heart disease       Relevant Orders   Lipid panel   Need for influenza vaccination       Relevant Orders   Flu Vaccine QUAD 40mo+IM (Fluarix, Fluzone & Alfiuria Quad PF) (Completed)   Large breasts       Referral placed for patient to see plastic surgery for evaluation for breast reduction.   Relevant Orders   Ambulatory referral to Plastic Surgery        Follow up plan: Return in about 6 months (around 10/06/2022) for HTN, HLD, DM2 FU.   LABORATORY TESTING:  - Pap smear:  pap up to date  IMMUNIZATIONS:   - Tdap: Tetanus vaccination status reviewed: last tetanus booster within 10 years. - Influenza: Given elsewhere - Pneumovax: Not applicable - Prevnar: Not applicable - HPV: Up to date - Zostavax vaccine: Not applicable  SCREENING: -Mammogram: Not applicable  - Colonoscopy: Not applicable  - Bone Density: Not applicable  -Hearing Test: Not applicable  -Spirometry: Not applicable   PATIENT COUNSELING:   Advised to take 1 mg of folate supplement per day if capable of pregnancy.   Sexuality: Discussed sexually transmitted diseases, partner selection, use of condoms, avoidance of unintended pregnancy  and contraceptive alternatives.   Advised to avoid cigarette smoking.  I discussed with the patient that most people either abstain from alcohol or drink within safe limits (<=14/week and <=4 drinks/occasion for males, <=7/weeks and <= 3 drinks/occasion for females) and that the risk for alcohol disorders and other health effects rises proportionally  with the number of drinks per week and how often a drinker exceeds daily limits.  Discussed cessation/primary prevention of drug use and availability of treatment for abuse.   Diet: Encouraged to adjust caloric intake to maintain  or achieve ideal body weight, to reduce intake of dietary saturated fat and total fat, to limit sodium intake by avoiding high sodium foods and not adding table salt, and to maintain adequate dietary potassium and calcium preferably from fresh fruits, vegetables, and low-fat dairy products.    stressed the  importance of regular exercise  Injury prevention: Discussed safety belts, safety helmets, smoke detector, smoking near bedding or upholstery.   Dental health: Discussed importance of regular tooth brushing, flossing, and dental visits.    NEXT PREVENTATIVE PHYSICAL DUE IN 1 YEAR. Return in about 6 months (around 10/06/2022) for HTN, HLD, DM2 FU.

## 2022-04-06 NOTE — Assessment & Plan Note (Signed)
Recommended eating smaller high protein, low fat meals more frequently and exercising 30 mins a day 5 times a week with a goal of 10-15lb weight loss in the next 3 months. Patient voiced their understanding and motivation to adhere to these recommendations.  

## 2022-04-06 NOTE — Assessment & Plan Note (Signed)
Chronic.  Controlled.  Continue with current medication regimen of Zoloft 25mg  daily.  Refills sent today.  Labs ordered today.  Return to clinic in 6 months for reevaluation.  Call sooner if concerns arise.

## 2022-04-07 LAB — CBC WITH DIFFERENTIAL/PLATELET
Basophils Absolute: 0.1 10*3/uL (ref 0.0–0.2)
Basos: 1 %
EOS (ABSOLUTE): 0.2 10*3/uL (ref 0.0–0.4)
Eos: 2 %
Hematocrit: 36.8 % (ref 34.0–46.6)
Hemoglobin: 12.3 g/dL (ref 11.1–15.9)
Immature Grans (Abs): 0 10*3/uL (ref 0.0–0.1)
Immature Granulocytes: 0 %
Lymphocytes Absolute: 3.6 10*3/uL — ABNORMAL HIGH (ref 0.7–3.1)
Lymphs: 38 %
MCH: 26.2 pg — ABNORMAL LOW (ref 26.6–33.0)
MCHC: 33.4 g/dL (ref 31.5–35.7)
MCV: 78 fL — ABNORMAL LOW (ref 79–97)
Monocytes Absolute: 0.5 10*3/uL (ref 0.1–0.9)
Monocytes: 5 %
Neutrophils Absolute: 5.1 10*3/uL (ref 1.4–7.0)
Neutrophils: 54 %
Platelets: 338 10*3/uL (ref 150–450)
RBC: 4.7 x10E6/uL (ref 3.77–5.28)
RDW: 13.2 % (ref 11.7–15.4)
WBC: 9.4 10*3/uL (ref 3.4–10.8)

## 2022-04-07 LAB — COMPREHENSIVE METABOLIC PANEL
ALT: 8 IU/L (ref 0–32)
AST: 12 IU/L (ref 0–40)
Albumin/Globulin Ratio: 1.6 (ref 1.2–2.2)
Albumin: 4.6 g/dL (ref 4.0–5.0)
Alkaline Phosphatase: 110 IU/L (ref 44–121)
BUN/Creatinine Ratio: 17 (ref 9–23)
BUN: 11 mg/dL (ref 6–20)
Bilirubin Total: <0.2 mg/dL (ref 0.0–1.2)
CO2: 24 mmol/L (ref 20–29)
Calcium: 9.5 mg/dL (ref 8.7–10.2)
Chloride: 102 mmol/L (ref 96–106)
Creatinine, Ser: 0.63 mg/dL (ref 0.57–1.00)
Globulin, Total: 2.9 g/dL (ref 1.5–4.5)
Glucose: 79 mg/dL (ref 70–99)
Potassium: 4.3 mmol/L (ref 3.5–5.2)
Sodium: 141 mmol/L (ref 134–144)
Total Protein: 7.5 g/dL (ref 6.0–8.5)
eGFR: 126 mL/min/{1.73_m2} (ref >59)

## 2022-04-07 LAB — LIPID PANEL
Chol/HDL Ratio: 2.9 ratio (ref 0.0–4.4)
Cholesterol, Total: 141 mg/dL (ref 100–199)
HDL: 49 mg/dL (ref >39)
LDL Chol Calc (NIH): 66 mg/dL (ref 0–99)
Triglycerides: 152 mg/dL — ABNORMAL HIGH (ref 0–149)
VLDL Cholesterol Cal: 26 mg/dL (ref 5–40)

## 2022-04-07 LAB — TSH: TSH: 2.99 u[IU]/mL (ref 0.450–4.500)

## 2022-04-07 NOTE — Progress Notes (Signed)
Hi Terri Wade. It was nice to see you yesterday.  Your lab work looks good.  Your red blood cells are on the smaller side but improved from prior.  No concerns at this time. Continue with your current medication regimen.  Follow up as discussed.  Please let me know if you have any questions.

## 2022-04-08 ENCOUNTER — Encounter: Admitting: Nurse Practitioner

## 2022-05-07 ENCOUNTER — Institutional Professional Consult (permissible substitution): Admitting: Plastic Surgery

## 2022-06-10 ENCOUNTER — Telehealth (INDEPENDENT_AMBULATORY_CARE_PROVIDER_SITE_OTHER): Admitting: Nurse Practitioner

## 2022-06-10 ENCOUNTER — Encounter: Payer: Self-pay | Admitting: Nurse Practitioner

## 2022-06-10 VITALS — Wt 198.0 lb

## 2022-06-10 DIAGNOSIS — R635 Abnormal weight gain: Secondary | ICD-10-CM | POA: Diagnosis not present

## 2022-06-10 MED ORDER — METFORMIN HCL 1000 MG PO TABS
1000.0000 mg | ORAL_TABLET | Freq: Two times a day (BID) | ORAL | 1 refills | Status: DC
Start: 2022-06-10 — End: 2022-07-16

## 2022-06-10 NOTE — Assessment & Plan Note (Signed)
Chronic. Will increase Metformin to 1000mg  BID.  Discussed side effects and benefits with patient during visit.  Follow up in 3 months.  Call sooner if concerns arise.

## 2022-06-10 NOTE — Progress Notes (Signed)
Wt 198 lb (89.8 kg)   BMI 35.07 kg/m    Subjective:    Patient ID: Terri Wade, female    DOB: 1996/03/25, 27 y.o.   MRN: [id number]  HPI: Terri Wade is a 27 y.o. female  Chief Complaint  Patient presents with   Weight Check    Pt states the Metformin she takes for weight management is not working as well as it was for her    WEIGHT GAIN Patient states she feels like the metformin is not curbing her appetite as well as it was in the past.  She was down to 185 lbs but has gained back some of her weight due to her appetite not being curbed as much.  Denies diarrhea, upset stomach nausea with the Metformin.   Relevant past medical, surgical, family and social history reviewed and updated as indicated. Interim medical history since our last visit reviewed. Allergies and medications reviewed and updated.  Review of Systems  Constitutional:  Positive for unexpected weight change.    Per HPI unless specifically indicated above     Objective:    Wt 198 lb (89.8 kg)   BMI 35.07 kg/m   Wt Readings from Last 3 Encounters:  06/10/22 198 lb (89.8 kg)  04/06/22 204 lb (92.5 kg)  10/07/21 210 lb 9.6 oz (95.5 kg)    Physical Exam Vitals and nursing note reviewed.  HENT:     Head: Normocephalic.     Right Ear: Hearing normal.     Left Ear: Hearing normal.     Nose: Nose normal.  Eyes:     Pupils: Pupils are equal, round, and reactive to light.  Pulmonary:     Effort: Pulmonary effort is normal. No respiratory distress.  Neurological:     Mental Status: She is alert.  Psychiatric:        Mood and Affect: Mood normal.        Behavior: Behavior normal.        Thought Content: Thought content normal.        Judgment: Judgment normal.     Results for orders placed or performed in visit on 04/06/22  Microscopic Examination   Urine  Result Value Ref Range   WBC, UA 0-5 0 - 5 /hpf   RBC, Urine 0-2 0 - 2 /hpf   Epithelial Cells (non renal) 0-10 0 - 10  /hpf   Bacteria, UA None seen None seen/Few  CBC with Differential/Platelet  Result Value Ref Range   WBC 9.4 3.4 - 10.8 x10E3/uL   RBC 4.70 3.77 - 5.28 x10E6/uL   Hemoglobin 12.3 11.1 - 15.9 g/dL   Hematocrit 36.8 34.0 - 46.6 %   MCV 78 (L) 79 - 97 fL   MCH 26.2 (L) 26.6 - 33.0 pg   MCHC 33.4 31.5 - 35.7 g/dL   RDW 13.2 11.7 - 15.4 %   Platelets 338 150 - 450 x10E3/uL   Neutrophils 54 Not Estab. %   Lymphs 38 Not Estab. %   Monocytes 5 Not Estab. %   Eos 2 Not Estab. %   Basos 1 Not Estab. %   Neutrophils Absolute 5.1 1.4 - 7.0 x10E3/uL   Lymphocytes Absolute 3.6 (H) 0.7 - 3.1 x10E3/uL   Monocytes Absolute 0.5 0.1 - 0.9 x10E3/uL   EOS (ABSOLUTE) 0.2 0.0 - 0.4 x10E3/uL   Basophils Absolute 0.1 0.0 - 0.2 x10E3/uL   Immature Granulocytes 0 Not Estab. %   Immature Grans (Abs) 0.0  0.0 - 0.1 x10E3/uL  Comprehensive metabolic panel  Result Value Ref Range   Glucose 79 70 - 99 mg/dL   BUN 11 6 - 20 mg/dL   Creatinine, Ser 0.63 0.57 - 1.00 mg/dL   eGFR 126 >59 mL/min/1.73   BUN/Creatinine Ratio 17 9 - 23   Sodium 141 134 - 144 mmol/L   Potassium 4.3 3.5 - 5.2 mmol/L   Chloride 102 96 - 106 mmol/L   CO2 24 20 - 29 mmol/L   Calcium 9.5 8.7 - 10.2 mg/dL   Total Protein 7.5 6.0 - 8.5 g/dL   Albumin 4.6 4.0 - 5.0 g/dL   Globulin, Total 2.9 1.5 - 4.5 g/dL   Albumin/Globulin Ratio 1.6 1.2 - 2.2   Bilirubin Total <0.2 0.0 - 1.2 mg/dL   Alkaline Phosphatase 110 44 - 121 IU/L   AST 12 0 - 40 IU/L   ALT 8 0 - 32 IU/L  Lipid panel  Result Value Ref Range   Cholesterol, Total 141 100 - 199 mg/dL   Triglycerides 152 (H) 0 - 149 mg/dL   HDL 49 >39 mg/dL   VLDL Cholesterol Cal 26 5 - 40 mg/dL   LDL Chol Calc (NIH) 66 0 - 99 mg/dL   Chol/HDL Ratio 2.9 0.0 - 4.4 ratio  TSH  Result Value Ref Range   TSH 2.990 0.450 - 4.500 uIU/mL  Urinalysis, Routine w reflex microscopic  Result Value Ref Range   Specific Gravity, UA >1.030 (H) 1.005 - 1.030   pH, UA 5.5 5.0 - 7.5   Color, UA Yellow  Yellow   Appearance Ur Cloudy (A) Clear   Leukocytes,UA Negative Negative   Protein,UA Negative Negative/Trace   Glucose, UA Negative Negative   Ketones, UA Negative Negative   RBC, UA 1+ (A) Negative   Bilirubin, UA Negative Negative   Urobilinogen, Ur 0.2 0.2 - 1.0 mg/dL   Nitrite, UA Negative Negative   Microscopic Examination See below:       Assessment & Plan:   Problem List Items Addressed This Visit       Other   Weight gain - Primary    Chronic. Will increase Metformin to 1000mg  BID.  Discussed side effects and benefits with patient during visit.  Follow up in 3 months.  Call sooner if concerns arise.         Follow up plan: Return in about 3 months (around 09/08/2022) for HTN, HLD, DM2 FU.  This visit was completed via MyChart due to the restrictions of the COVID-19 pandemic. All issues as above were discussed and addressed. Physical exam was done as above through visual confirmation on MyChart. If it was felt that the patient should be evaluated in the office, they were directed there. The patient verbally consented to this visit. Location of the patient: Home Location of the provider: Office Those involved with this call:  Provider: Jon Billings, NP CMA: Yvonna Alanis, CMA Front Desk/Registration: Lynnell Catalan This encounter was conducted via video.  I spent 30 dedicated to the care of this patient on the date of this encounter to include previsit review of symptoms, plan of care and follow up, face to face time with the patient, and post visit ordering of testing.

## 2022-07-16 ENCOUNTER — Encounter: Payer: Self-pay | Admitting: Nurse Practitioner

## 2022-07-16 ENCOUNTER — Telehealth: Admitting: Nurse Practitioner

## 2022-07-16 DIAGNOSIS — R635 Abnormal weight gain: Secondary | ICD-10-CM | POA: Diagnosis not present

## 2022-07-16 DIAGNOSIS — R0683 Snoring: Secondary | ICD-10-CM | POA: Diagnosis not present

## 2022-07-16 DIAGNOSIS — Z6836 Body mass index (BMI) 36.0-36.9, adult: Secondary | ICD-10-CM

## 2022-07-16 NOTE — Assessment & Plan Note (Addendum)
Chronic.  Has worsened since last sleep study.  Has ongoing fatigue and witnessed apnea episodes.  Will order new sleep study for further evaluation.  Follow up after sleep study is complete.

## 2022-07-16 NOTE — Assessment & Plan Note (Signed)
Chronic.  Not tolerating Metformin any longer.  Has not tolerated phentermine and contrave in the past.  Patient has an appt with GI in April due to ongoing abdominal pain and symptoms. If cleared by GI will start GLP1 for weight loss.  Follow up after GI.

## 2022-07-16 NOTE — Progress Notes (Signed)
There were no vitals taken for this visit.   Subjective:    Patient ID: Terri Wade, female    DOB: 1995-05-15, 27 y.o.   MRN: [id number]  HPI: Terri Wade is a 27 y.o. female  Chief Complaint  Patient presents with   Sleep Apnea    Pt states she thinks she may have some sleep apnea- states she has been snoring a lot lately and has been feeling very fatigued in the morning.    SLEEP APNEA Patient would like to do another sleep study.  Patient has been very fatigued and snoring more lately. She had a sleep study at the end of last year.    Last sleep study: 2023 Treatments attempted:  Wakes feeling refreshed:  no Daytime hypersomnolence:  yes Fatigue:  yes Insomnia:   difficulty staying asleep Good sleep hygiene:  no Difficulty falling asleep:  no Difficulty staying asleep:  yes Snoring bothers bed partner:  yes Observed apnea by bed partner: yes Obesity:  no Hypertension: no  Pulmonary hypertension:  no Coronary artery disease:  no  Patient states she has decreased the amount of Metformin she is taking less Metformin.  She is having stomach cramping still and increased hunger.    WEIGHT GAIN Patient has tried contrave, phentermine and metformin.  She has had side effects with all of them and not able to tolerate them.  Patient is open to trying an injectable.    Relevant past medical, surgical, family and social history reviewed and updated as indicated. Interim medical history since our last visit reviewed. Allergies and medications reviewed and updated.  Review of Systems  Constitutional:  Positive for unexpected weight change.  HENT:         Snoring  Gastrointestinal:  Positive for abdominal pain and diarrhea.  Psychiatric/Behavioral:  Positive for sleep disturbance.     Per HPI unless specifically indicated above     Objective:    There were no vitals taken for this visit.  Wt Readings from Last 3 Encounters:  06/10/22 198 lb (89.8  kg)  04/06/22 204 lb (92.5 kg)  10/07/21 210 lb 9.6 oz (95.5 kg)    Physical Exam Vitals and nursing note reviewed.  HENT:     Head: Normocephalic.     Right Ear: Hearing normal.     Left Ear: Hearing normal.     Nose: Nose normal.  Eyes:     Pupils: Pupils are equal, round, and reactive to light.  Pulmonary:     Effort: Pulmonary effort is normal. No respiratory distress.  Neurological:     Mental Status: She is alert.  Psychiatric:        Mood and Affect: Mood normal.        Behavior: Behavior normal.        Thought Content: Thought content normal.        Judgment: Judgment normal.     Results for orders placed or performed in visit on 04/06/22  Microscopic Examination   Urine  Result Value Ref Range   WBC, UA 0-5 0 - 5 /hpf   RBC, Urine 0-2 0 - 2 /hpf   Epithelial Cells (non renal) 0-10 0 - 10 /hpf   Bacteria, UA None seen None seen/Few  CBC with Differential/Platelet  Result Value Ref Range   WBC 9.4 3.4 - 10.8 x10E3/uL   RBC 4.70 3.77 - 5.28 x10E6/uL   Hemoglobin 12.3 11.1 - 15.9 g/dL   Hematocrit 36.8 34.0 - 46.6 %  MCV 78 (L) 79 - 97 fL   MCH 26.2 (L) 26.6 - 33.0 pg   MCHC 33.4 31.5 - 35.7 g/dL   RDW 13.2 11.7 - 15.4 %   Platelets 338 150 - 450 x10E3/uL   Neutrophils 54 Not Estab. %   Lymphs 38 Not Estab. %   Monocytes 5 Not Estab. %   Eos 2 Not Estab. %   Basos 1 Not Estab. %   Neutrophils Absolute 5.1 1.4 - 7.0 x10E3/uL   Lymphocytes Absolute 3.6 (H) 0.7 - 3.1 x10E3/uL   Monocytes Absolute 0.5 0.1 - 0.9 x10E3/uL   EOS (ABSOLUTE) 0.2 0.0 - 0.4 x10E3/uL   Basophils Absolute 0.1 0.0 - 0.2 x10E3/uL   Immature Granulocytes 0 Not Estab. %   Immature Grans (Abs) 0.0 0.0 - 0.1 x10E3/uL  Comprehensive metabolic panel  Result Value Ref Range   Glucose 79 70 - 99 mg/dL   BUN 11 6 - 20 mg/dL   Creatinine, Ser 0.63 0.57 - 1.00 mg/dL   eGFR 126 >59 mL/min/1.73   BUN/Creatinine Ratio 17 9 - 23   Sodium 141 134 - 144 mmol/L   Potassium 4.3 3.5 - 5.2 mmol/L    Chloride 102 96 - 106 mmol/L   CO2 24 20 - 29 mmol/L   Calcium 9.5 8.7 - 10.2 mg/dL   Total Protein 7.5 6.0 - 8.5 g/dL   Albumin 4.6 4.0 - 5.0 g/dL   Globulin, Total 2.9 1.5 - 4.5 g/dL   Albumin/Globulin Ratio 1.6 1.2 - 2.2   Bilirubin Total <0.2 0.0 - 1.2 mg/dL   Alkaline Phosphatase 110 44 - 121 IU/L   AST 12 0 - 40 IU/L   ALT 8 0 - 32 IU/L  Lipid panel  Result Value Ref Range   Cholesterol, Total 141 100 - 199 mg/dL   Triglycerides 152 (H) 0 - 149 mg/dL   HDL 49 >39 mg/dL   VLDL Cholesterol Cal 26 5 - 40 mg/dL   LDL Chol Calc (NIH) 66 0 - 99 mg/dL   Chol/HDL Ratio 2.9 0.0 - 4.4 ratio  TSH  Result Value Ref Range   TSH 2.990 0.450 - 4.500 uIU/mL  Urinalysis, Routine w reflex microscopic  Result Value Ref Range   Specific Gravity, UA >1.030 (H) 1.005 - 1.030   pH, UA 5.5 5.0 - 7.5   Color, UA Yellow Yellow   Appearance Ur Cloudy (A) Clear   Leukocytes,UA Negative Negative   Protein,UA Negative Negative/Trace   Glucose, UA Negative Negative   Ketones, UA Negative Negative   RBC, UA 1+ (A) Negative   Bilirubin, UA Negative Negative   Urobilinogen, Ur 0.2 0.2 - 1.0 mg/dL   Nitrite, UA Negative Negative   Microscopic Examination See below:       Assessment & Plan:   Problem List Items Addressed This Visit       Other   BMI 36.0-36.9,adult    Chronic.  Not tolerating Metformin any longer.  Has not tolerated phentermine and contrave in the past.  Patient has an appt with GI in April due to ongoing abdominal pain and symptoms. If cleared by GI will start GLP1 for weight loss.  Follow up after GI.       Snoring - Primary    Chronic.  Has worsened since last sleep study.  Has ongoing fatigue and witnessed apnea episodes.  Will order new sleep study for further evaluation.  Follow up after sleep study is complete.  Follow up plan: Return if symptoms worsen or fail to improve.  This visit was completed via MyChart due to the restrictions of the COVID-19  pandemic. All issues as above were discussed and addressed. Physical exam was done as above through visual confirmation on MyChart. If it was felt that the patient should be evaluated in the office, they were directed there. The patient verbally consented to this visit. Location of the patient: Home Location of the provider: Office Those involved with this call:  Provider: Jon Billings, NP CMA: Yvonna Alanis, CMA Front Desk/Registration: Lynnell Catalan This encounter was conducted via video.  I spent 20 dedicated to the care of this patient on the date of this encounter to include previsit review of symptoms, plan of care and follow up, face to face time with the patient, and post visit ordering of testing.

## 2022-07-20 ENCOUNTER — Telehealth: Admitting: Nurse Practitioner

## 2022-07-24 ENCOUNTER — Encounter: Payer: Self-pay | Admitting: Nurse Practitioner

## 2022-07-24 DIAGNOSIS — Z6836 Body mass index (BMI) 36.0-36.9, adult: Secondary | ICD-10-CM

## 2022-08-14 ENCOUNTER — Encounter: Payer: Self-pay | Admitting: Nurse Practitioner

## 2022-08-14 ENCOUNTER — Telehealth: Admitting: Nurse Practitioner

## 2022-08-14 DIAGNOSIS — N926 Irregular menstruation, unspecified: Secondary | ICD-10-CM

## 2022-08-14 MED ORDER — METFORMIN HCL 500 MG PO TABS: 500.0000 mg | ORAL_TABLET | Freq: Every day | ORAL | 1 refills | Status: DC

## 2022-08-14 NOTE — Progress Notes (Signed)
LMP  (LMP Unknown)    Subjective:    Patient ID: Terri Wade, female    DOB: 1995/05/15, 27 y.o.   MRN: [id number]  HPI: Terri Wade is a 27 y.o. female  Chief Complaint  Patient presents with   Cramping/irregular bleeding    For past month or two   Patient states for the last two months since starting the metformin she is having more cramping and irregular bleeding.  She states it is radiating down to her legs.  When she did have sex recently she did bled a lot. White she was on the metformin she was having regular periods.  She is also having some chin hairs.    Relevant past medical, surgical, family and social history reviewed and updated as indicated. Interim medical history since our last visit reviewed. Allergies and medications reviewed and updated.  Review of Systems  Genitourinary:  Positive for menstrual problem and vaginal bleeding.  Skin:        Facial hairs    Per HPI unless specifically indicated above     Objective:    LMP  (LMP Unknown)   Wt Readings from Last 3 Encounters:  06/10/22 198 lb (89.8 kg)  04/06/22 204 lb (92.5 kg)  10/07/21 210 lb 9.6 oz (95.5 kg)    Physical Exam Vitals and nursing note reviewed.  HENT:     Head: Normocephalic.     Right Ear: Hearing normal.     Left Ear: Hearing normal.     Nose: Nose normal.  Eyes:     Pupils: Pupils are equal, round, and reactive to light.  Pulmonary:     Effort: Pulmonary effort is normal. No respiratory distress.  Neurological:     Mental Status: She is alert.  Psychiatric:        Mood and Affect: Mood normal.        Behavior: Behavior normal.        Thought Content: Thought content normal.        Judgment: Judgment normal.     Results for orders placed or performed in visit on 04/06/22  Microscopic Examination   Urine  Result Value Ref Range   WBC, UA 0-5 0 - 5 /hpf   RBC, Urine 0-2 0 - 2 /hpf   Epithelial Cells (non renal) 0-10 0 - 10 /hpf   Bacteria, UA None  seen None seen/Few  CBC with Differential/Platelet  Result Value Ref Range   WBC 9.4 3.4 - 10.8 x10E3/uL   RBC 4.70 3.77 - 5.28 x10E6/uL   Hemoglobin 12.3 11.1 - 15.9 g/dL   Hematocrit 94.1 74.0 - 46.6 %   MCV 78 (L) 79 - 97 fL   MCH 26.2 (L) 26.6 - 33.0 pg   MCHC 33.4 31.5 - 35.7 g/dL   RDW 81.4 48.1 - 85.6 %   Platelets 338 150 - 450 x10E3/uL   Neutrophils 54 Not Estab. %   Lymphs 38 Not Estab. %   Monocytes 5 Not Estab. %   Eos 2 Not Estab. %   Basos 1 Not Estab. %   Neutrophils Absolute 5.1 1.4 - 7.0 x10E3/uL   Lymphocytes Absolute 3.6 (H) 0.7 - 3.1 x10E3/uL   Monocytes Absolute 0.5 0.1 - 0.9 x10E3/uL   EOS (ABSOLUTE) 0.2 0.0 - 0.4 x10E3/uL   Basophils Absolute 0.1 0.0 - 0.2 x10E3/uL   Immature Granulocytes 0 Not Estab. %   Immature Grans (Abs) 0.0 0.0 - 0.1 x10E3/uL  Comprehensive metabolic panel  Result Value Ref Range   Glucose 79 70 - 99 mg/dL   BUN 11 6 - 20 mg/dL   Creatinine, Ser 0.98 0.57 - 1.00 mg/dL   eGFR 119 >14 NW/GNF/6.21   BUN/Creatinine Ratio 17 9 - 23   Sodium 141 134 - 144 mmol/L   Potassium 4.3 3.5 - 5.2 mmol/L   Chloride 102 96 - 106 mmol/L   CO2 24 20 - 29 mmol/L   Calcium 9.5 8.7 - 10.2 mg/dL   Total Protein 7.5 6.0 - 8.5 g/dL   Albumin 4.6 4.0 - 5.0 g/dL   Globulin, Total 2.9 1.5 - 4.5 g/dL   Albumin/Globulin Ratio 1.6 1.2 - 2.2   Bilirubin Total <0.2 0.0 - 1.2 mg/dL   Alkaline Phosphatase 110 44 - 121 IU/L   AST 12 0 - 40 IU/L   ALT 8 0 - 32 IU/L  Lipid panel  Result Value Ref Range   Cholesterol, Total 141 100 - 199 mg/dL   Triglycerides 308 (H) 0 - 149 mg/dL   HDL 49 >65 mg/dL   VLDL Cholesterol Cal 26 5 - 40 mg/dL   LDL Chol Calc (NIH) 66 0 - 99 mg/dL   Chol/HDL Ratio 2.9 0.0 - 4.4 ratio  TSH  Result Value Ref Range   TSH 2.990 0.450 - 4.500 uIU/mL  Urinalysis, Routine w reflex microscopic  Result Value Ref Range   Specific Gravity, UA >1.030 (H) 1.005 - 1.030   pH, UA 5.5 5.0 - 7.5   Color, UA Yellow Yellow   Appearance Ur  Cloudy (A) Clear   Leukocytes,UA Negative Negative   Protein,UA Negative Negative/Trace   Glucose, UA Negative Negative   Ketones, UA Negative Negative   RBC, UA 1+ (A) Negative   Bilirubin, UA Negative Negative   Urobilinogen, Ur 0.2 0.2 - 1.0 mg/dL   Nitrite, UA Negative Negative   Microscopic Examination See below:       Assessment & Plan:   Problem List Items Addressed This Visit       Other   Irregular periods - Primary    Suspect it is related to PCOS due to improvement with metformin and now having facial hair. Will restart Metformin  daily to see if symptoms improve.  Follow up in 1 month.  Call sooner if concerns arise.         Follow up plan: Return in about 1 month (around 09/13/2022) for Medication Management (virtual).   This visit was completed via MyChart due to the restrictions of the COVID-19 pandemic. All issues as above were discussed and addressed. Physical exam was done as above through visual confirmation on MyChart. If it was felt that the patient should be evaluated in the office, they were directed there. The patient verbally consented to this visit. Location of the patient: Home Location of the provider: Office Those involved with this call:  Provider: Larae Grooms, NP CMA: Lauris Chroman, CMA Front Desk/Registration: Servando Snare This encounter was conducted via video.  I spent 30 dedicated to the care of this patient on the date of this encounter to include previsit review of symptoms, plan of care and follow up, face to face time with the patient, and post visit ordering of testing.

## 2022-08-14 NOTE — Assessment & Plan Note (Signed)
Suspect it is related to PCOS due to improvement with metformin and now having facial hair. Will restart Metformin 500mg  daily to see if symptoms improve.  Follow up in 1 month.  Call sooner if concerns arise.

## 2022-08-14 NOTE — Progress Notes (Signed)
Called patient to schedule an appt. Left message on machine for her to call the office and schedule. CRM was placed for [facility]

## 2022-08-20 ENCOUNTER — Telehealth: Admitting: Nurse Practitioner

## 2022-08-20 ENCOUNTER — Encounter: Payer: Self-pay | Admitting: Nurse Practitioner

## 2022-08-20 DIAGNOSIS — Z6835 Body mass index (BMI) 35.0-35.9, adult: Secondary | ICD-10-CM | POA: Diagnosis not present

## 2022-08-20 DIAGNOSIS — E669 Obesity, unspecified: Secondary | ICD-10-CM | POA: Diagnosis not present

## 2022-08-20 DIAGNOSIS — Z6836 Body mass index (BMI) 36.0-36.9, adult: Secondary | ICD-10-CM

## 2022-08-20 MED ORDER — WEGOVY 0.25 MG/0.5ML ~~LOC~~ SOAJ: 0.2500 mg | SUBCUTANEOUS | 0 refills | Status: DC

## 2022-08-20 NOTE — Assessment & Plan Note (Addendum)
Chronic. Ongoing problem.  Patient would like to discuss starting [facility].  She has been on Phentermine, Contrave, Wellbutrin and Metformin to aid in weight loss but had side effects with all of them that ultimately led to stopping the medications.  Will start Wegovy 0.25mg  once weekly.  Side effects and benefits of medication discussed.  Discussed how to inject medication.  Discussed how to titrate medication up.  Follow up in 6 weeks.  Call sooner if concerns arise.

## 2022-08-20 NOTE — Progress Notes (Signed)
Wt 200 lb (90.7 kg)   LMP  (LMP Unknown)   BMI 35.43 kg/m    Subjective:    Patient ID: Terri Wade, female    DOB: February 22, 1996, 27 y.o.   MRN: [id number]  HPI: Terri Wade is a 27 y.o. female  Chief Complaint  Patient presents with   Obesity   WEIGHT GAIN Duration: Year Previous attempts at weight loss: yes Complications of obesity: back pain Peak weight: 204lb Weight loss goal: 140lb Weight loss to date: 1 year Requesting obesity pharmacotherapy: yes Current weight loss supplements/medications: no Previous weight loss supplements/meds: yes Calories:  Patient has tried phentermine, contrave, wellbutrin and metformin.   Relevant past medical, surgical, family and social history reviewed and updated as indicated. Interim medical history since our last visit reviewed. Allergies and medications reviewed and updated.  Review of Systems  Constitutional:  Positive for unexpected weight change.    Per HPI unless specifically indicated above     Objective:    Wt 200 lb (90.7 kg)   LMP  (LMP Unknown)   BMI 35.43 kg/m   Wt Readings from Last 3 Encounters:  08/20/22 200 lb (90.7 kg)  06/10/22 198 lb (89.8 kg)  04/06/22 204 lb (92.5 kg)    Physical Exam Vitals and nursing note reviewed.  HENT:     Head: Normocephalic.     Right Ear: Hearing normal.     Left Ear: Hearing normal.     Nose: Nose normal.  Eyes:     Pupils: Pupils are equal, round, and reactive to light.  Pulmonary:     Effort: Pulmonary effort is normal. No respiratory distress.  Neurological:     Mental Status: She is alert.  Psychiatric:        Mood and Affect: Mood normal.        Behavior: Behavior normal.        Thought Content: Thought content normal.        Judgment: Judgment normal.     Results for orders placed or performed in visit on 04/06/22  Microscopic Examination   Urine  Result Value Ref Range   WBC, UA 0-5 0 - 5 /hpf   RBC, Urine 0-2 0 - 2 /hpf    Epithelial Cells (non renal) 0-10 0 - 10 /hpf   Bacteria, UA None seen None seen/Few  CBC with Differential/Platelet  Result Value Ref Range   WBC 9.4 3.4 - 10.8 x10E3/uL   RBC 4.70 3.77 - 5.28 x10E6/uL   Hemoglobin 12.3 11.1 - 15.9 g/dL   Hematocrit 40.9 81.1 - 46.6 %   MCV 78 (L) 79 - 97 fL   MCH 26.2 (L) 26.6 - 33.0 pg   MCHC 33.4 31.5 - 35.7 g/dL   RDW 91.4 78.2 - 95.6 %   Platelets 338 150 - 450 x10E3/uL   Neutrophils 54 Not Estab. %   Lymphs 38 Not Estab. %   Monocytes 5 Not Estab. %   Eos 2 Not Estab. %   Basos 1 Not Estab. %   Neutrophils Absolute 5.1 1.4 - 7.0 x10E3/uL   Lymphocytes Absolute 3.6 (H) 0.7 - 3.1 x10E3/uL   Monocytes Absolute 0.5 0.1 - 0.9 x10E3/uL   EOS (ABSOLUTE) 0.2 0.0 - 0.4 x10E3/uL   Basophils Absolute 0.1 0.0 - 0.2 x10E3/uL   Immature Granulocytes 0 Not Estab. %   Immature Grans (Abs) 0.0 0.0 - 0.1 x10E3/uL  Comprehensive metabolic panel  Result Value Ref Range   Glucose 79  70 - 99 mg/dL   BUN 11 6 - 20 mg/dL   Creatinine, Ser 1.61 0.57 - 1.00 mg/dL   eGFR 096 >04 VW/UJW/1.19   BUN/Creatinine Ratio 17 9 - 23   Sodium 141 134 - 144 mmol/L   Potassium 4.3 3.5 - 5.2 mmol/L   Chloride 102 96 - 106 mmol/L   CO2 24 20 - 29 mmol/L   Calcium 9.5 8.7 - 10.2 mg/dL   Total Protein 7.5 6.0 - 8.5 g/dL   Albumin 4.6 4.0 - 5.0 g/dL   Globulin, Total 2.9 1.5 - 4.5 g/dL   Albumin/Globulin Ratio 1.6 1.2 - 2.2   Bilirubin Total <0.2 0.0 - 1.2 mg/dL   Alkaline Phosphatase 110 44 - 121 IU/L   AST 12 0 - 40 IU/L   ALT 8 0 - 32 IU/L  Lipid panel  Result Value Ref Range   Cholesterol, Total 141 100 - 199 mg/dL   Triglycerides 147 (H) 0 - 149 mg/dL   HDL 49 >82 mg/dL   VLDL Cholesterol Cal 26 5 - 40 mg/dL   LDL Chol Calc (NIH) 66 0 - 99 mg/dL   Chol/HDL Ratio 2.9 0.0 - 4.4 ratio  TSH  Result Value Ref Range   TSH 2.990 0.450 - 4.500 uIU/mL  Urinalysis, Routine w reflex microscopic  Result Value Ref Range   Specific Gravity, UA >1.030 (H) 1.005 - 1.030    pH, UA 5.5 5.0 - 7.5   Color, UA Yellow Yellow   Appearance Ur Cloudy (A) Clear   Leukocytes,UA Negative Negative   Protein,UA Negative Negative/Trace   Glucose, UA Negative Negative   Ketones, UA Negative Negative   RBC, UA 1+ (A) Negative   Bilirubin, UA Negative Negative   Urobilinogen, Ur 0.2 0.2 - 1.0 mg/dL   Nitrite, UA Negative Negative   Microscopic Examination See below:       Assessment & Plan:   Problem List Items Addressed This Visit       Other   BMI 36.0-36.9,adult    Chronic. Ongoing problem.  Patient would like to discuss starting [facility].  She has been on Phentermine, Contrave, Wellbutrin and Metformin to aid in weight loss but had side effects with all of them that ultimately led to stopping the medications.  Will start [facility] 0.25mg  once weekly.  Side effects and benefits of medication discussed.  Discussed how to inject medication.  Discussed how to titrate medication up.  Follow up in 6 weeks.  Call sooner if concerns arise.         Follow up plan: Return in about 6 weeks (around 10/01/2022) for Weight Managment.   This visit was completed via MyChart due to the restrictions of the COVID-19 pandemic. All issues as above were discussed and addressed. Physical exam was done as above through visual confirmation on MyChart. If it was felt that the patient should be evaluated in the office, they were directed there. The patient verbally consented to this visit. Location of the patient: Home Location of the provider: Office Those involved with this call:  Provider: Larae Grooms, NP CMA: NA Front Desk/Registration: Servando Snare This encounter was conducted via video.  I spent 30 dedicated to the care of this patient on the date of this encounter to include previsit review of symptoms, plan of care and follow up, face to face time with the patient, and post visit ordering of testing.

## 2022-08-24 ENCOUNTER — Telehealth: Payer: Self-pay

## 2022-08-24 NOTE — Telephone Encounter (Signed)
PA for [facility] initiated and submitted via Cover My Meds. Key: [id number]

## 2022-08-26 MED ORDER — ZEPBOUND 2.5 MG/0.5ML ~~LOC~~ SOAJ: 2.5000 mg | SUBCUTANEOUS | 0 refills | Status: DC

## 2022-08-27 NOTE — Telephone Encounter (Signed)
PA denied. New medication sent in for the patient. See mychart messages.

## 2022-08-28 MED ORDER — ZEPBOUND 2.5 MG/0.5ML ~~LOC~~ SOAJ: 2.5000 mg | SUBCUTANEOUS | 0 refills | Status: DC

## 2022-09-02 ENCOUNTER — Encounter

## 2022-09-11 ENCOUNTER — Encounter: Payer: Self-pay | Admitting: Nurse Practitioner

## 2022-09-11 ENCOUNTER — Telehealth (INDEPENDENT_AMBULATORY_CARE_PROVIDER_SITE_OTHER): Admitting: Nurse Practitioner

## 2022-09-11 DIAGNOSIS — E669 Obesity, unspecified: Secondary | ICD-10-CM

## 2022-09-11 DIAGNOSIS — Z6836 Body mass index (BMI) 36.0-36.9, adult: Secondary | ICD-10-CM | POA: Diagnosis not present

## 2022-09-11 MED ORDER — ZEPBOUND 5 MG/0.5ML ~~LOC~~ SOAJ: 5.0000 mg | SUBCUTANEOUS | 1 refills | Status: DC

## 2022-09-11 NOTE — Progress Notes (Signed)
Called and scheduled patient for 10/01/2022 @ 10:40 am.

## 2022-09-11 NOTE — Assessment & Plan Note (Signed)
Chronic. Doing well with Zepbound.  Will increase dose to Zepbound 5mg  weekly.  Side effects and benefits discussed.  No longer taking metformin.  Follow up in 6 weeks.  Call sooner if concerns arise.

## 2022-09-11 NOTE — Progress Notes (Signed)
Wt 204 lb (92.5 kg)   LMP  (LMP Unknown)   BMI 36.14 kg/m    Subjective:    Patient ID: Terri Wade, female    DOB: Mar 12, 1996, 27 y.o.   MRN: [id number]  HPI: Terri Wade is a 27 y.o. female  Chief Complaint  Patient presents with   Obesity   WEIGHT GAIN Duration: years  Previous attempts at weight loss: yes- metformin, contrave, diet and exercise Complications of obesity: irregular periods Peak weight:  Weight loss goal:  Weight loss to date:  Requesting obesity pharmacotherapy: yes Current weight loss supplements/medications: yes Previous weight loss supplements/meds: yes   She has lost 7lbs since starting the medications.  Tomorrow she will get her third injection.  She is not having any side effects from the medication.  She is no longer taking the Metformin.  Not having any cramping.   Relevant past medical, surgical, family and social history reviewed and updated as indicated. Interim medical history since our last visit reviewed. Allergies and medications reviewed and updated.  Review of Systems  Constitutional:  Negative for unexpected weight change.  Skin:        Facial hairs    Per HPI unless specifically indicated above     Objective:    Wt 204 lb (92.5 kg)   LMP  (LMP Unknown)   BMI 36.14 kg/m   Wt Readings from Last 3 Encounters:  09/11/22 204 lb (92.5 kg)  08/20/22 200 lb (90.7 kg)  06/10/22 198 lb (89.8 kg)    Physical Exam Vitals and nursing note reviewed.  HENT:     Head: Normocephalic.     Right Ear: Hearing normal.     Left Ear: Hearing normal.     Nose: Nose normal.  Eyes:     Pupils: Pupils are equal, round, and reactive to light.  Pulmonary:     Effort: Pulmonary effort is normal. No respiratory distress.  Neurological:     Mental Status: She is alert.  Psychiatric:        Mood and Affect: Mood normal.        Behavior: Behavior normal.        Thought Content: Thought content normal.        Judgment:  Judgment normal.     Results for orders placed or performed in visit on 04/06/22  Microscopic Examination   Urine  Result Value Ref Range   WBC, UA 0-5 0 - 5 /hpf   RBC, Urine 0-2 0 - 2 /hpf   Epithelial Cells (non renal) 0-10 0 - 10 /hpf   Bacteria, UA None seen None seen/Few  CBC with Differential/Platelet  Result Value Ref Range   WBC 9.4 3.4 - 10.8 x10E3/uL   RBC 4.70 3.77 - 5.28 x10E6/uL   Hemoglobin 12.3 11.1 - 15.9 g/dL   Hematocrit 11.9 14.7 - 46.6 %   MCV 78 (L) 79 - 97 fL   MCH 26.2 (L) 26.6 - 33.0 pg   MCHC 33.4 31.5 - 35.7 g/dL   RDW 82.9 56.2 - 13.0 %   Platelets 338 150 - 450 x10E3/uL   Neutrophils 54 Not Estab. %   Lymphs 38 Not Estab. %   Monocytes 5 Not Estab. %   Eos 2 Not Estab. %   Basos 1 Not Estab. %   Neutrophils Absolute 5.1 1.4 - 7.0 x10E3/uL   Lymphocytes Absolute 3.6 (H) 0.7 - 3.1 x10E3/uL   Monocytes Absolute 0.5 0.1 - 0.9 x10E3/uL  EOS (ABSOLUTE) 0.2 0.0 - 0.4 x10E3/uL   Basophils Absolute 0.1 0.0 - 0.2 x10E3/uL   Immature Granulocytes 0 Not Estab. %   Immature Grans (Abs) 0.0 0.0 - 0.1 x10E3/uL  Comprehensive metabolic panel  Result Value Ref Range   Glucose 79 70 - 99 mg/dL   BUN 11 6 - 20 mg/dL   Creatinine, Ser 1.61 0.57 - 1.00 mg/dL   eGFR 096 >04 VW/UJW/1.19   BUN/Creatinine Ratio 17 9 - 23   Sodium 141 134 - 144 mmol/L   Potassium 4.3 3.5 - 5.2 mmol/L   Chloride 102 96 - 106 mmol/L   CO2 24 20 - 29 mmol/L   Calcium 9.5 8.7 - 10.2 mg/dL   Total Protein 7.5 6.0 - 8.5 g/dL   Albumin 4.6 4.0 - 5.0 g/dL   Globulin, Total 2.9 1.5 - 4.5 g/dL   Albumin/Globulin Ratio 1.6 1.2 - 2.2   Bilirubin Total <0.2 0.0 - 1.2 mg/dL   Alkaline Phosphatase 110 44 - 121 IU/L   AST 12 0 - 40 IU/L   ALT 8 0 - 32 IU/L  Lipid panel  Result Value Ref Range   Cholesterol, Total 141 100 - 199 mg/dL   Triglycerides 147 (H) 0 - 149 mg/dL   HDL 49 >82 mg/dL   VLDL Cholesterol Cal 26 5 - 40 mg/dL   LDL Chol Calc (NIH) 66 0 - 99 mg/dL   Chol/HDL Ratio  2.9 0.0 - 4.4 ratio  TSH  Result Value Ref Range   TSH 2.990 0.450 - 4.500 uIU/mL  Urinalysis, Routine w reflex microscopic  Result Value Ref Range   Specific Gravity, UA >1.030 (H) 1.005 - 1.030   pH, UA 5.5 5.0 - 7.5   Color, UA Yellow Yellow   Appearance Ur Cloudy (A) Clear   Leukocytes,UA Negative Negative   Protein,UA Negative Negative/Trace   Glucose, UA Negative Negative   Ketones, UA Negative Negative   RBC, UA 1+ (A) Negative   Bilirubin, UA Negative Negative   Urobilinogen, Ur 0.2 0.2 - 1.0 mg/dL   Nitrite, UA Negative Negative   Microscopic Examination See below:       Assessment & Plan:   Problem List Items Addressed This Visit       Other   BMI 36.0-36.9,adult    Chronic. Doing well with Zepbound.  Will increase dose to Zepbound 5mg  weekly.  Side effects and benefits discussed.  No longer taking metformin.  Follow up in 6 weeks.  Call sooner if concerns arise.         Follow up plan: Return in about 6 weeks (around 10/23/2022) for Weight Managment.   This visit was completed via MyChart due to the restrictions of the COVID-19 pandemic. All issues as above were discussed and addressed. Physical exam was done as above through visual confirmation on MyChart. If it was felt that the patient should be evaluated in the office, they were directed there. The patient verbally consented to this visit. Location of the patient: Home Location of the provider: Office Those involved with this call:  Provider: Larae Grooms, NP CMA: Wilhemena Durie, CMA Front Desk/Registration: Servando Snare This encounter was conducted via video.  I spent 30 dedicated to the care of this patient on the date of this encounter to include previsit review of symptoms, plan of care and follow up, face to face time with the patient, and post visit ordering of testing.

## 2022-09-14 ENCOUNTER — Encounter: Payer: Self-pay | Admitting: Nurse Practitioner

## 2022-09-19 ENCOUNTER — Other Ambulatory Visit: Payer: Self-pay | Admitting: Nurse Practitioner

## 2022-09-21 NOTE — Telephone Encounter (Signed)
Requested medication (s) are due for refill today - no  Requested medication (s) are on the active medication list -yes  Future visit scheduled -yes  Last refill: 09/11/22 2ml 1RF  Notes to clinic: off protocol- requires provider review   Requested Prescriptions  Pending Prescriptions Disp Refills   ZEPBOUND 2.5 MG/0.5ML Pen [Pharmacy Med Name: ZEPBOUND 2.5MG /0.5ML INJ (4PF PENS)] 2 mL 0    Sig: ADMINISTER 2.5 MG UNDER THE SKIN 1 TIME A WEEK     Off-Protocol Failed - 09/19/2022 10:40 AM      Failed - Medication not assigned to a protocol, review manually.      Passed - Valid encounter within last 12 months    Recent Outpatient Visits           1 week ago BMI 36.0-36.9,adult   [place] [facility] Larae Grooms, NP   1 month ago BMI 36.0-36.9,adult   [place] [facility] Larae Grooms, NP   1 month ago Irregular periods   [place] [facility] Larae Grooms, NP   2 months ago Snoring   [place] [facility] Larae Grooms, NP   3 months ago Weight gain   [place] [facility] Larae Grooms, NP       Future Appointments             In 1 week Larae Grooms, NP [place] [facility]   In 1 month Larae Grooms, NP [place] [facility]               Requested Prescriptions  Pending Prescriptions Disp Refills   ZEPBOUND 2.5 MG/0.5ML Pen [Pharmacy Med Name: ZEPBOUND 2.5MG /0.5ML INJ (4PF PENS)] 2 mL 0    Sig: ADMINISTER 2.5 MG UNDER THE SKIN 1 TIME A WEEK     Off-Protocol Failed - 09/19/2022 10:40 AM      Failed - Medication not assigned to a protocol, review manually.      Passed - Valid encounter within last 12 months    Recent Outpatient Visits           1 week ago BMI 36.0-36.9,adult   [place] [facility] Larae Grooms, NP   1 month ago BMI 36.0-36.9,adult   [place] [facility]  Larae Grooms, NP   1 month ago Irregular periods   [place] [facility] Larae Grooms, NP   2 months ago Snoring   [place] [facility] Larae Grooms, NP   3 months ago Weight gain   [place] [facility] Larae Grooms, NP       Future Appointments             In 1 week Larae Grooms, NP  [facility]   In 1 month Larae Grooms, NP [facility], [facility]

## 2022-09-21 NOTE — Telephone Encounter (Signed)
Formatting of this note is different from the original.  Requested medication (s) are due for refill today - no    Requested medication (s) are on the active medication list -yes    Future visit scheduled -yes    Last refill: 09/11/22 2ml 1RF    Notes to clinic: off protocol- requires provider review     Requested Prescriptions   Pending Prescriptions Disp Refills    ZEPBOUND 2.5 MG/0.5ML Pen [Pharmacy Med Name: ZEPBOUND 2.5MG /0.5ML INJ (4PF PENS)] 2 mL 0     Sig: ADMINISTER 2.5 MG UNDER THE SKIN 1 TIME A WEEK      Off-Protocol Failed - 09/19/2022 10:40 AM       Failed - Medication not assigned to a protocol, review manually.       Passed - Valid encounter within last 12 months     Recent Outpatient Visits         1 week ago BMI 36.0-36.9,adult    [facility] Larae Grooms, NP    1 month ago BMI 36.0-36.9,adult    [facility] Larae Grooms, NP    1 month ago Irregular periods    [facility]ae Grooms, NP    2 months ago Snoring    [facility] Larae Grooms, NP    3 months ago Weight gain    [facility] Larae Grooms, NP         Future Appointments         In 1 week Larae Grooms, NP [facility]    In 1 month Larae Grooms, NP [facility]                 Requested Prescriptions   Pending Prescriptions Disp Refills    ZEPBOUND 2.5 MG/0.5ML Pen [Pharmacy Med Name: ZEPBOUND 2.5MG /0.5ML INJ (4PF PENS)] 2 mL 0     Sig: ADMINISTER 2.5 MG UNDER THE SKIN 1 TIME A WEEK      Off-Protocol Failed - 09/19/2022 10:40 AM       Failed - Medication not assigned to a protocol, review manually.       Passed - Valid encounter within last 12 months     Recent Outpatient Visits         1 week ago BMI 36.0-36.9,adult    [facility] Larae Grooms, NP    1 month ago BMI 36.0-36.9,adult    [facility] Larae Grooms[facility] NP    1 month ago Irregular periods    [facility] Larae Grooms, NP    2 months ago Snoring    [facility] Larae Grooms, NP    3 months ago Weight gain    [facility] Larae Grooms, NP         Future Appointments         In 1 week Larae Grooms, NP [facility]    In 1 month Larae Grooms, NP [facility], [facility]                   Electronically signed by Elson Areas, RN at 09/21/2022  2:40 PM EDT

## 2022-09-27 ENCOUNTER — Ambulatory Visit: Payer: TRICARE (CHAMPUS)

## 2022-09-27 DIAGNOSIS — K769 Liver disease, unspecified: Secondary | ICD-10-CM

## 2022-09-27 MED ORDER — GADOTERIDOL 279.3 MG/ML IV SOLN
279.3 | Freq: Once | INTRAVENOUS | Status: AC | PRN
Start: 2022-09-27 — End: 2022-09-27
  Administered 2022-09-27: 19:00:00 18 mL via INTRAVENOUS

## 2022-09-29 IMAGING — US US ABDOMEN LIMITED
1 series · 14 of 25 positions shown · non-contrast
Comparison: None Available.

CLINICAL DATA: Right upper quadrant pain

EXAM:
ULTRASOUND ABDOMEN LIMITED RIGHT UPPER QUADRANT

[Series 1: us abdomen limited · 0.25mm/px · 14 of 46 slices shown]
[im 1/46]
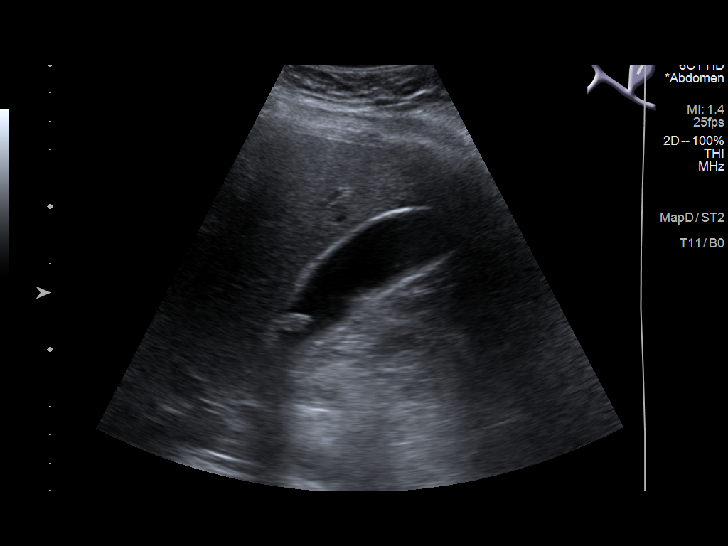
[im 4/46]
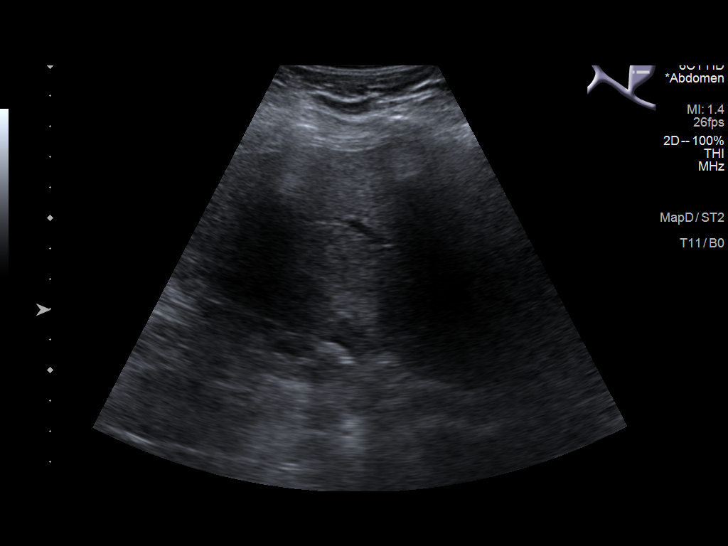
[im 8/46]
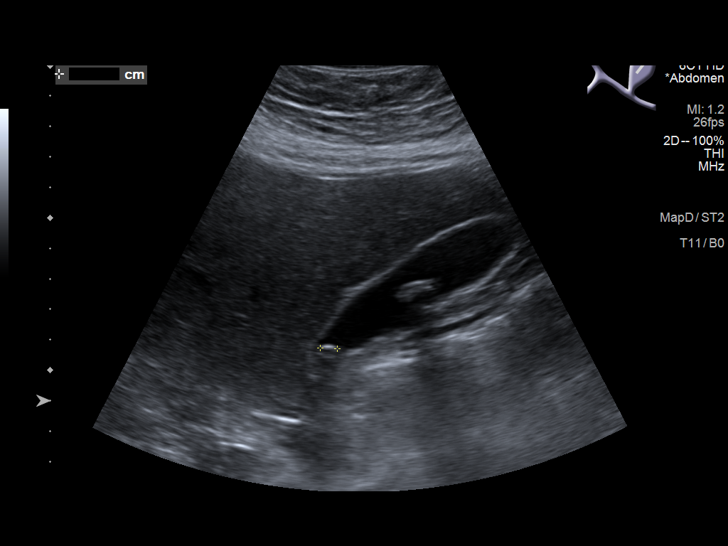
[im 12/46]
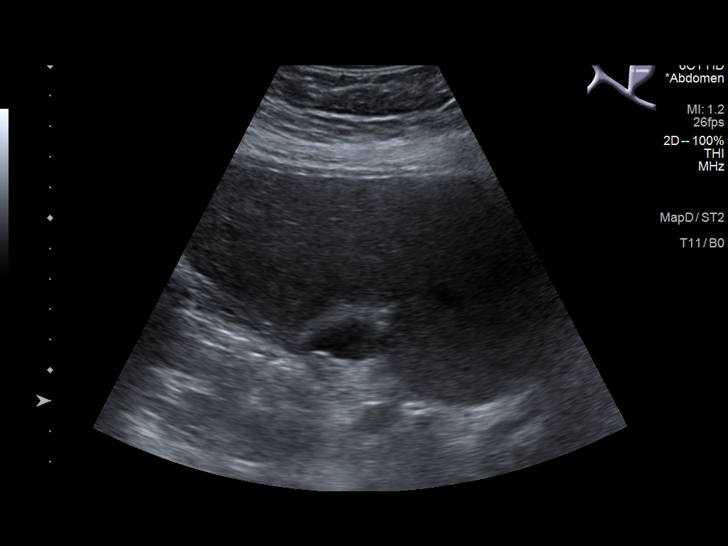
[im 16/46]
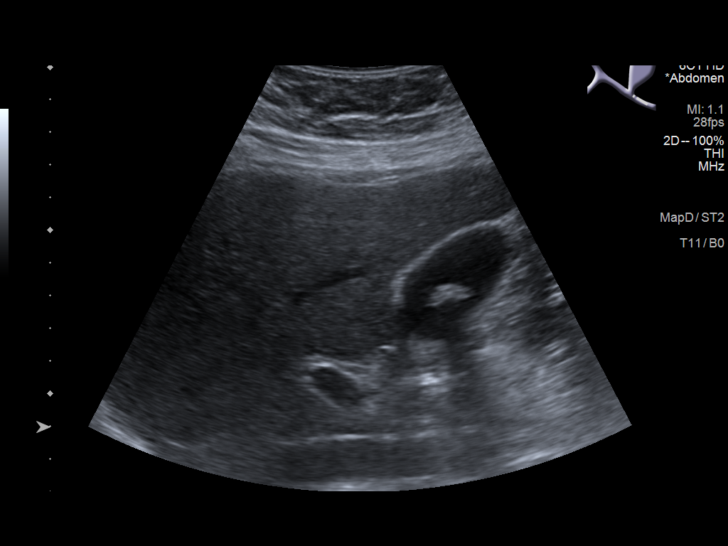
[im 17/46]
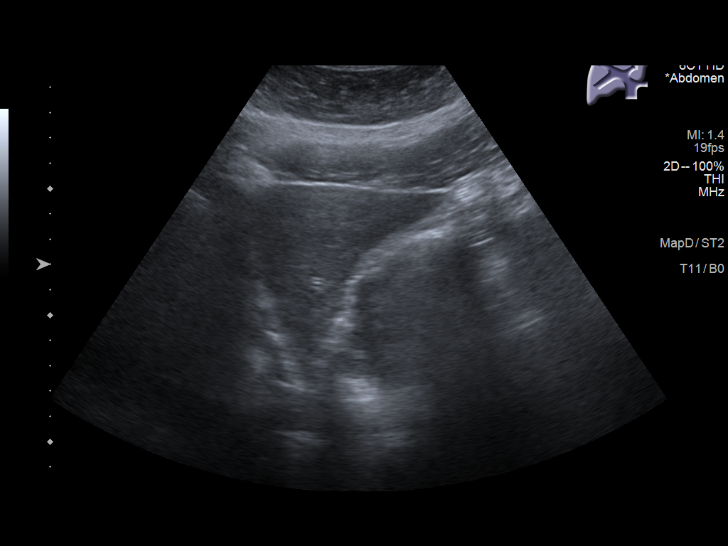
[im 21/46]
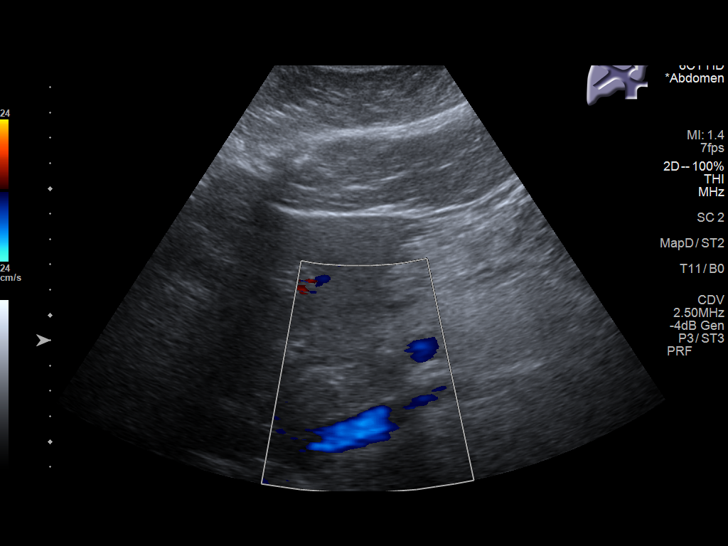
[im 25/46]
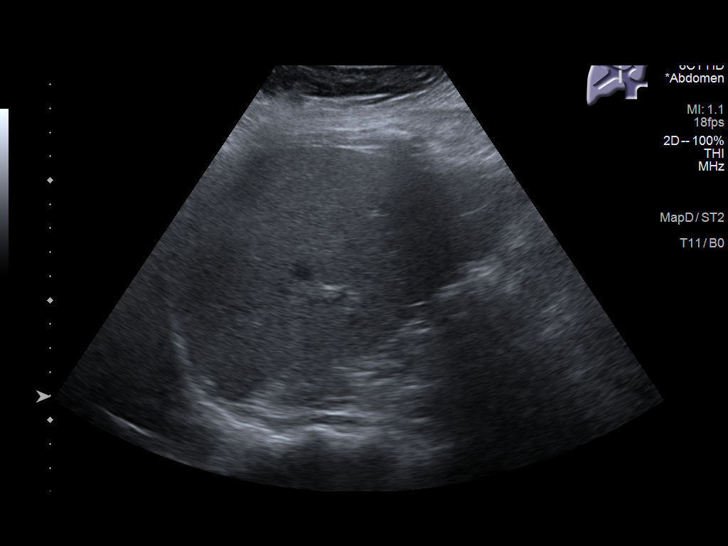
[im 29/46]
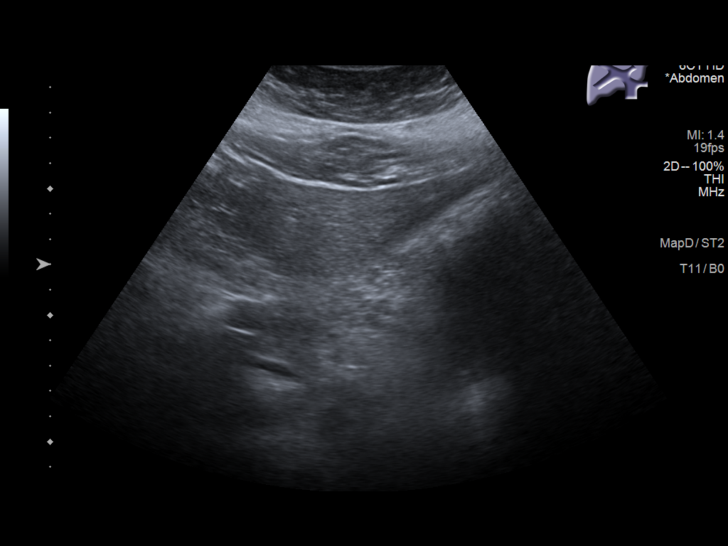
[im 31/46]
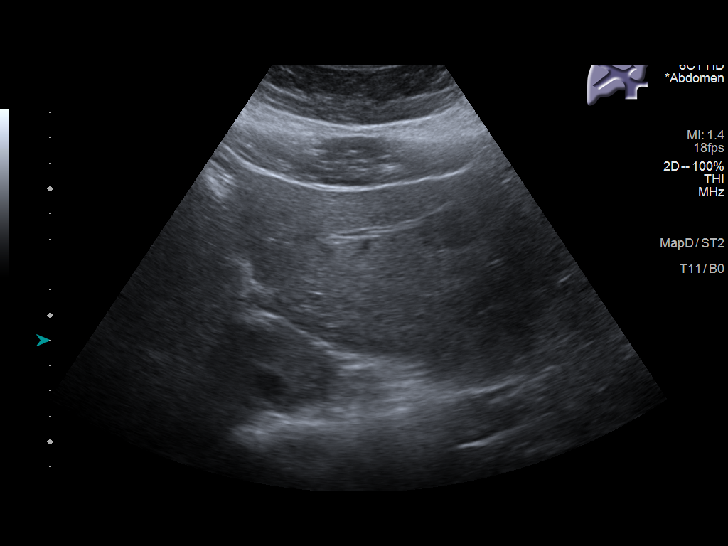
[im 34/46]
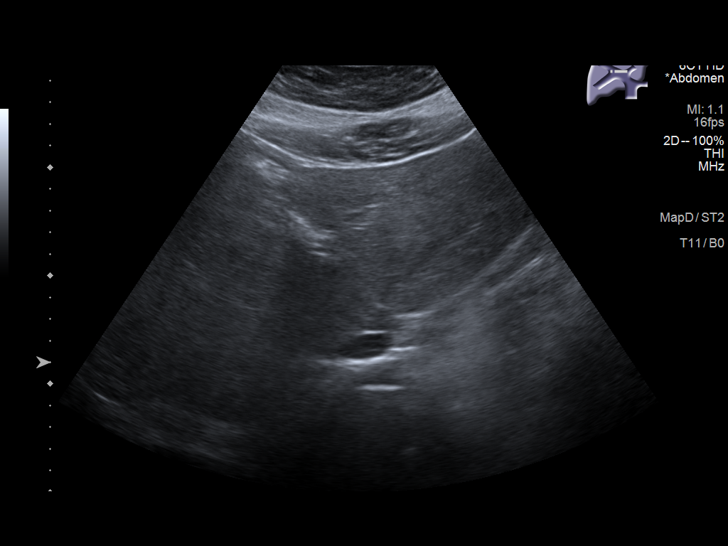
[im 38/46]
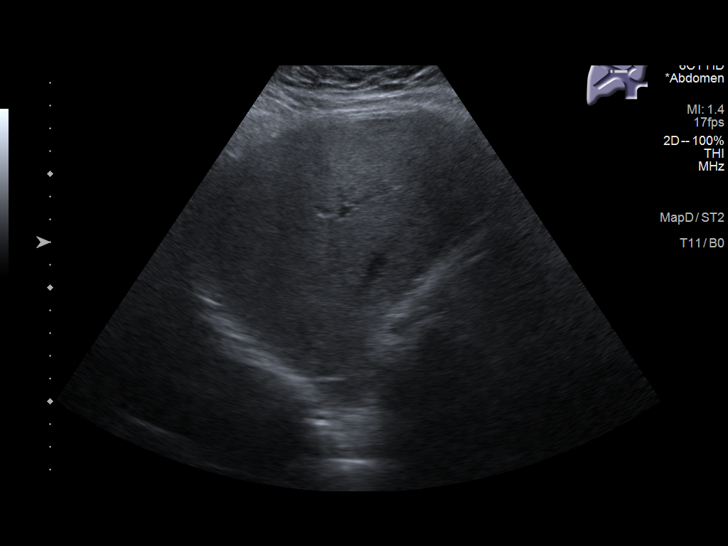
[im 42/46]
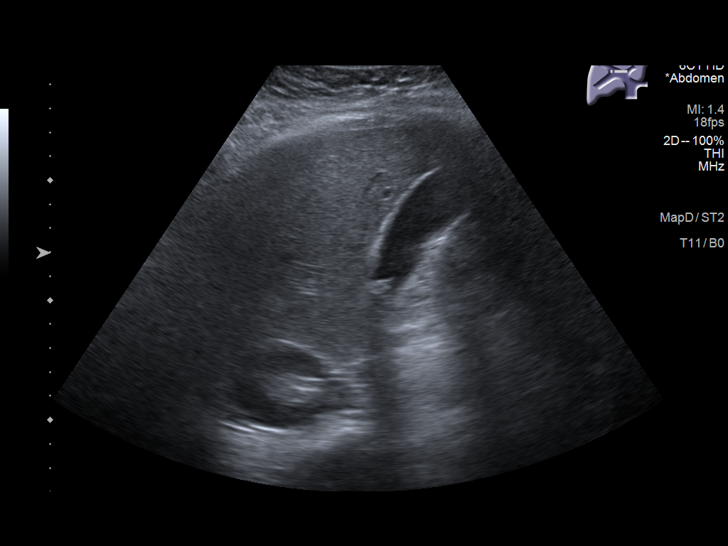
[im 46/46]
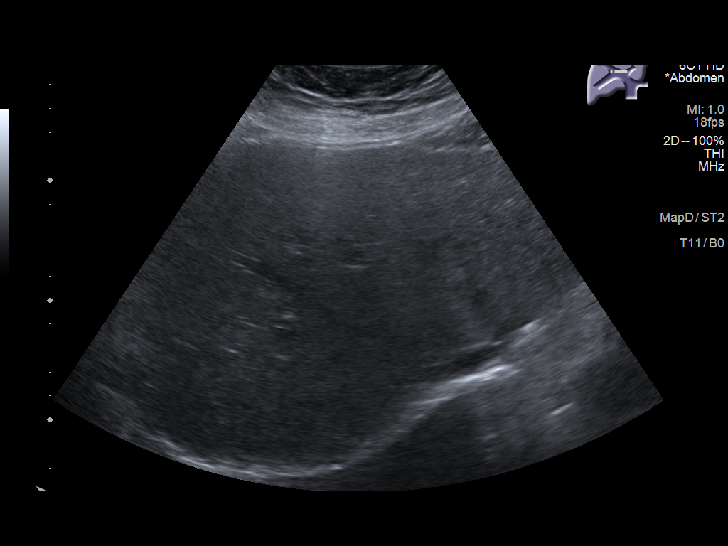

[14 of 25 positions shown; findings below may reference images not displayed]

FINDINGS: Gallbladder:

Contains a few calculi measuring up to 1.5 cm. No wall thickening or
pericholecystic fluid. Negative sonographic Murphy's sign.

Common bile duct:

Diameter: 2 mm

Liver:

Increased echogenicity of the parenchyma with no focal mass
identified. Portal vein is patent on color Doppler imaging with
normal direction of blood flow towards the liver.

Other: None.
IMPRESSION: 1. Cholelithiasis.
2. Evidence of hepatic steatosis.

## 2022-10-01 ENCOUNTER — Encounter: Payer: Self-pay | Admitting: Nurse Practitioner

## 2022-10-01 ENCOUNTER — Ambulatory Visit: Admitting: Nurse Practitioner

## 2022-10-01 VITALS — BP 105/68 | HR 77 | Temp 98.4°F | Wt 199.2 lb

## 2022-10-01 DIAGNOSIS — F339 Major depressive disorder, recurrent, unspecified: Secondary | ICD-10-CM | POA: Diagnosis not present

## 2022-10-01 DIAGNOSIS — R635 Abnormal weight gain: Secondary | ICD-10-CM | POA: Diagnosis not present

## 2022-10-01 MED ORDER — SERTRALINE HCL 25 MG PO TABS
25.0000 mg | ORAL_TABLET | Freq: Every day | ORAL | 1 refills | Status: DC
Start: 2022-10-01 — End: 2023-04-16

## 2022-10-01 MED ORDER — ZEPBOUND 5 MG/0.5ML ~~LOC~~ SOAJ
5.0000 mg | SUBCUTANEOUS | 2 refills | Status: DC
Start: 2022-10-01 — End: 2023-02-22

## 2022-10-01 NOTE — Progress Notes (Signed)
BP 105/68   Pulse 77   Temp 98.4 F (36.9 C) (Oral)   Wt 199 lb 3.2 oz (90.4 kg)   LMP 09/21/2022 (Exact Date)   SpO2 98%   BMI 35.29 kg/m    Subjective:    Patient ID: Terri Wade, female    DOB: [birth year], 27 y.o.   MRN: [id number]  HPI: Terri Wade is a 27 y.o. female  Chief Complaint  Patient presents with   Weight Check   WEIGHT MANAGEMENT Patient is currently taking Zepbound.  She is taking the 5mg  of Zepbound.  She has lost about 15lbs since starting the medication.      Relevant past medical, surgical, family and social history reviewed and updated as indicated. Interim medical history since our last visit reviewed. Allergies and medications reviewed and updated.  Review of Systems  Constitutional:  Negative for unexpected weight change.    Per HPI unless specifically indicated above     Objective:    BP 105/68   Pulse 77   Temp 98.4 F (36.9 C) (Oral)   Wt 199 lb 3.2 oz (90.4 kg)   LMP 09/21/2022 (Exact Date)   SpO2 98%   BMI 35.29 kg/m   Wt Readings from Last 3 Encounters:  10/01/22 199 lb 3.2 oz (90.4 kg)  09/11/22 204 lb (92.5 kg)  08/20/22 200 lb (90.7 kg)    Physical Exam Vitals and nursing note reviewed.  Constitutional:      General: She is not in acute distress.    Appearance: Normal appearance. She is normal weight. She is not ill-appearing, toxic-appearing or diaphoretic.  HENT:     Head: Normocephalic.     Right Ear: External ear normal.     Left Ear: External ear normal.     Nose: Nose normal.     Mouth/Throat:     Mouth: Mucous membranes are moist.     Pharynx: Oropharynx is clear.  Eyes:     General:        Right eye: No discharge.        Left eye: No discharge.     Extraocular Movements: Extraocular movements intact.     Conjunctiva/sclera: Conjunctivae normal.     Pupils: Pupils are equal, round, and reactive to light.  Cardiovascular:     Rate and Rhythm: Normal rate and regular rhythm.      Heart sounds: No murmur heard. Pulmonary:     Effort: Pulmonary effort is normal. No respiratory distress.     Breath sounds: Normal breath sounds. No wheezing or rales.  Musculoskeletal:     Cervical back: Normal range of motion and neck supple.  Skin:    General: Skin is warm and dry.     Capillary Refill: Capillary refill takes less than 2 seconds.  Neurological:     General: No focal deficit present.     Mental Status: She is alert and oriented to person, place, and time. Mental status is at baseline.  Psychiatric:        Mood and Affect: Mood normal.        Behavior: Behavior normal.        Thought Content: Thought content normal.        Judgment: Judgment normal.     Results for orders placed or performed in visit on 04/06/22  Microscopic Examination   Urine  Result Value Ref Range   WBC, UA 0-5 0 - 5 /hpf   RBC, Urine 0-2 0 -  2 /hpf   Epithelial Cells (non renal) 0-10 0 - 10 /hpf   Bacteria, UA None seen None seen/Few  CBC with Differential/Platelet  Result Value Ref Range   WBC 9.4 3.4 - 10.8 x10E3/uL   RBC 4.70 3.77 - 5.28 x10E6/uL   Hemoglobin 12.3 11.1 - 15.9 g/dL   Hematocrit 40.9 81.1 - 46.6 %   MCV 78 (L) 79 - 97 fL   MCH 26.2 (L) 26.6 - 33.0 pg   MCHC 33.4 31.5 - 35.7 g/dL   RDW 91.4 78.2 - 95.6 %   Platelets 338 150 - 450 x10E3/uL   Neutrophils 54 Not Estab. %   Lymphs 38 Not Estab. %   Monocytes 5 Not Estab. %   Eos 2 Not Estab. %   Basos 1 Not Estab. %   Neutrophils Absolute 5.1 1.4 - 7.0 x10E3/uL   Lymphocytes Absolute 3.6 (H) 0.7 - 3.1 x10E3/uL   Monocytes Absolute 0.5 0.1 - 0.9 x10E3/uL   EOS (ABSOLUTE) 0.2 0.0 - 0.4 x10E3/uL   Basophils Absolute 0.1 0.0 - 0.2 x10E3/uL   Immature Granulocytes 0 Not Estab. %   Immature Grans (Abs) 0.0 0.0 - 0.1 x10E3/uL  Comprehensive metabolic panel  Result Value Ref Range   Glucose 79 70 - 99 mg/dL   BUN 11 6 - 20 mg/dL   Creatinine, Ser 2.13 0.57 - 1.00 mg/dL   eGFR 086 >57 QI/ONG/2.95   BUN/Creatinine  Ratio 17 9 - 23   Sodium 141 134 - 144 mmol/L   Potassium 4.3 3.5 - 5.2 mmol/L   Chloride 102 96 - 106 mmol/L   CO2 24 20 - 29 mmol/L   Calcium 9.5 8.7 - 10.2 mg/dL   Total Protein 7.5 6.0 - 8.5 g/dL   Albumin 4.6 4.0 - 5.0 g/dL   Globulin, Total 2.9 1.5 - 4.5 g/dL   Albumin/Globulin Ratio 1.6 1.2 - 2.2   Bilirubin Total <0.2 0.0 - 1.2 mg/dL   Alkaline Phosphatase 110 44 - 121 IU/L   AST 12 0 - 40 IU/L   ALT 8 0 - 32 IU/L  Lipid panel  Result Value Ref Range   Cholesterol, Total 141 100 - 199 mg/dL   Triglycerides 284 (H) 0 - 149 mg/dL   HDL 49 >13 mg/dL   VLDL Cholesterol Cal 26 5 - 40 mg/dL   LDL Chol Calc (NIH) 66 0 - 99 mg/dL   Chol/HDL Ratio 2.9 0.0 - 4.4 ratio  TSH  Result Value Ref Range   TSH 2.990 0.450 - 4.500 uIU/mL  Urinalysis, Routine w reflex microscopic  Result Value Ref Range   Specific Gravity, UA >1.030 (H) 1.005 - 1.030   pH, UA 5.5 5.0 - 7.5   Color, UA Yellow Yellow   Appearance Ur Cloudy (A) Clear   Leukocytes,UA Negative Negative   Protein,UA Negative Negative/Trace   Glucose, UA Negative Negative   Ketones, UA Negative Negative   RBC, UA 1+ (A) Negative   Bilirubin, UA Negative Negative   Urobilinogen, Ur 0.2 0.2 - 1.0 mg/dL   Nitrite, UA Negative Negative   Microscopic Examination See below:       Assessment & Plan:   Problem List Items Addressed This Visit       Other   Depression, recurrent (HCC)    Chronic.  Controlled.  Continue with current medication regimen of Zoloft 25mg  daily.  Labs ordered today.  Return to clinic in 6 months for reevaluation.  Call sooner if concerns arise.  Relevant Medications   sertraline (ZOLOFT) 25 MG tablet   Weight gain - Primary    Chronic.  On Zepbound and has lost about 15lbs.  Having minimal side effects.  Will continue with current dose of Zepbound.  Refills sent today.  Follow up in 3 months.  Call sooner if concerns arise.       Relevant Orders   Comp Met (CMET)     Follow up  plan: Return in about 3 months (around 01/01/2023) for Weight Managment (virtual).

## 2022-10-01 NOTE — Assessment & Plan Note (Addendum)
Chronic.  On Zepbound and has lost about 15lbs.  Having minimal side effects.  Will continue with current dose of Zepbound.  Refills sent today.  Follow up in 3 months.  Call sooner if concerns arise.

## 2022-10-01 NOTE — Assessment & Plan Note (Signed)
Chronic.  Controlled.  Continue with current medication regimen of Zoloft 25mg  daily.  Labs ordered today.  Return to clinic in 6 months for reevaluation.  Call sooner if concerns arise.

## 2022-10-02 LAB — COMPREHENSIVE METABOLIC PANEL
ALT: 10 IU/L (ref 0–32)
AST: 14 IU/L (ref 0–40)
Albumin/Globulin Ratio: 1.4 (ref 1.2–2.2)
Albumin: 4.6 g/dL (ref 4.0–5.0)
Alkaline Phosphatase: 94 IU/L (ref 44–121)
BUN/Creatinine Ratio: 20 (ref 9–23)
BUN: 12 mg/dL (ref 6–20)
Bilirubin Total: 0.3 mg/dL (ref 0.0–1.2)
CO2: 23 mmol/L (ref 20–29)
Calcium: 9.9 mg/dL (ref 8.7–10.2)
Chloride: 101 mmol/L (ref 96–106)
Creatinine, Ser: 0.6 mg/dL (ref 0.57–1.00)
Globulin, Total: 3.2 g/dL (ref 1.5–4.5)
Glucose: 81 mg/dL (ref 70–99)
Potassium: 4.2 mmol/L (ref 3.5–5.2)
Sodium: 137 mmol/L (ref 134–144)
Total Protein: 7.8 g/dL (ref 6.0–8.5)
eGFR: 127 mL/min/{1.73_m2} (ref >59)

## 2022-10-02 NOTE — Progress Notes (Signed)
Hi Terri Wade. It was nice to see you yesterday.  Your lab work looks good.  No concerns at this time. Continue with your current medication regimen.  Follow up as discussed.  Please let me know if you have any questions.

## 2022-10-06 ENCOUNTER — Ambulatory Visit: Admitting: Nurse Practitioner

## 2022-10-08 ENCOUNTER — Telehealth: Payer: Self-pay | Admitting: Nurse Practitioner

## 2022-10-08 ENCOUNTER — Ambulatory Visit: Payer: Self-pay

## 2022-10-08 NOTE — Telephone Encounter (Signed)
  Chief Complaint: medication assistance Symptoms: NA Frequency: today Pertinent Negatives: NA Disposition: [] ED /[] Urgent Care (no appt availability in office) / [] Appointment(In office/virtual)/ []  [facility]/ [] Home Care/ [] Refused Recommended Disposition /[] [facility]/ []  Follow-up with PCP Additional Notes: [facility] called back after speaking with [place], CMA regarding ICD 10, per [place] advised ok to use Z68.36. spoke with Sylar, Rx Tech and provided ICD 10 code, just needed since insurance denying and pt can use savings card. No further assistance needed.   Summary: ICD Code Advice   Pharmacy is calling to request ICD code for tirzepatide [facility]) 5 MG/0.5ML Pen [[id number]] so that the patient may use pharmacy discount card.         Reason for Disposition  Pharmacy calling with prescription question and triager answers question  Answer Assessment - Initial Assessment Questions 1. NAME of MEDICINE: "What medicine(s) are you calling about?"     Zepbound  2. QUESTION: "What is your question?" (e.g., double dose of medicine, side effect)     Needing ICD 10 code 3. PRESCRIBER: "Who prescribed the medicine?" Reason: if prescribed by specialist, call should be referred to that group.     Clydie Braun, NP  Protocols used: Medication Question Call-A-AH

## 2022-10-23 ENCOUNTER — Telehealth (INDEPENDENT_AMBULATORY_CARE_PROVIDER_SITE_OTHER): Admitting: Nurse Practitioner

## 2022-10-23 ENCOUNTER — Encounter: Payer: Self-pay | Admitting: Nurse Practitioner

## 2022-10-23 DIAGNOSIS — Z713 Dietary counseling and surveillance: Secondary | ICD-10-CM

## 2022-10-23 DIAGNOSIS — Z6836 Body mass index (BMI) 36.0-36.9, adult: Secondary | ICD-10-CM

## 2022-10-23 MED ORDER — ZEPBOUND 7.5 MG/0.5ML ~~LOC~~ SOAJ: 7.5000 mg | SUBCUTANEOUS | 2 refills | Status: DC

## 2022-10-23 NOTE — Progress Notes (Signed)
Called and schedule patient on 02/22/2023 @ 3:20 pm.

## 2022-10-23 NOTE — Progress Notes (Signed)
LMP 09/21/2022 (Exact Date)    Subjective:    Patient ID: Terri Wade, female    DOB: Jun 12, 1995, 27 y.o.   MRN: [id number]  HPI: Terri Wade is a 27 y.o. female  Chief Complaint  Patient presents with   Depression   WEIGHT MANAGEMENT Patient is currently taking Zepbound.  She was able to get the Zepbound.  She had trouble getting the medication from the pharmacy.  She weighed herself yesterday at 189lb.  She has lost a total of 22lbs since she started the medication.  She is taking the 5mg  of Zepbound.      Relevant past medical, surgical, family and social history reviewed and updated as indicated. Interim medical history since our last visit reviewed. Allergies and medications reviewed and updated.  Review of Systems  Constitutional:  Negative for unexpected weight change.    Per HPI unless specifically indicated above     Objective:    LMP 09/21/2022 (Exact Date)   Wt Readings from Last 3 Encounters:  10/01/22 199 lb 3.2 oz (90.4 kg)  09/11/22 204 lb (92.5 kg)  08/20/22 200 lb (90.7 kg)    Physical Exam Vitals and nursing note reviewed.  HENT:     Head: Normocephalic.     Right Ear: Hearing normal.     Left Ear: Hearing normal.     Nose: Nose normal.  Eyes:     Pupils: Pupils are equal, round, and reactive to light.  Pulmonary:     Effort: Pulmonary effort is normal. No respiratory distress.  Neurological:     Mental Status: She is alert.  Psychiatric:        Mood and Affect: Mood normal.        Behavior: Behavior normal.        Thought Content: Thought content normal.        Judgment: Judgment normal.     Results for orders placed or performed in visit on 10/01/22  Comp Met (CMET)  Result Value Ref Range   Glucose 81 70 - 99 mg/dL   BUN 12 6 - 20 mg/dL   Creatinine, Ser 4.09 0.57 - 1.00 mg/dL   eGFR 811 >91 YN/WGN/5.62   BUN/Creatinine Ratio 20 9 - 23   Sodium 137 134 - 144 mmol/L   Potassium 4.2 3.5 - 5.2 mmol/L   Chloride  101 96 - 106 mmol/L   CO2 23 20 - 29 mmol/L   Calcium 9.9 8.7 - 10.2 mg/dL   Total Protein 7.8 6.0 - 8.5 g/dL   Albumin 4.6 4.0 - 5.0 g/dL   Globulin, Total 3.2 1.5 - 4.5 g/dL   Albumin/Globulin Ratio 1.4 1.2 - 2.2   Bilirubin Total 0.3 0.0 - 1.2 mg/dL   Alkaline Phosphatase 94 44 - 121 IU/L   AST 14 0 - 40 IU/L   ALT 10 0 - 32 IU/L      Assessment & Plan:   Problem List Items Addressed This Visit       Other   BMI 36.0-36.9,adult - Primary    Chronic. Doing well.  Has lost 22lbs total since starting Zepbound.  Will continue with Zepbound 5mg  for another month.  Then increase dose to 7.5mg .  Follow up in 4 months.  Call sooner if concerns arise.       Relevant Medications   tirzepatide (ZEPBOUND) 7.5 MG/0.5ML Pen     Follow up plan: Return in about 4 months (around 02/22/2023) for Weight Managment (virtual).  This visit was completed via MyChart due to the restrictions of the COVID-19 pandemic. All issues as above were discussed and addressed. Physical exam was done as above through visual confirmation on MyChart. If it was felt that the patient should be evaluated in the office, they were directed there. The patient verbally consented to this visit. Location of the patient: Home Location of the provider: Office Those involved with this call:  Provider: Larae Grooms, NP CMA: Maggie Font, CMA Front Desk/Registration: Servando Snare This encounter was conducted via video.  I spent 30 dedicated to the care of this patient on the date of this encounter to include previsit review of symptoms, plan of care and follow up, face to face time with the patient, and post visit ordering of testing.

## 2022-10-23 NOTE — Assessment & Plan Note (Signed)
Chronic. Doing well.  Has lost 22lbs total since starting Zepbound.  Will continue with Zepbound 5mg  for another month.  Then increase dose to 7.5mg .  Follow up in 4 months.  Call sooner if concerns arise.

## 2022-10-28 ENCOUNTER — Telehealth: Payer: Self-pay

## 2022-10-28 NOTE — Telephone Encounter (Signed)
PA for Zepbound initiated and submitted via Cover My Meds. Key: [id number]

## 2022-12-11 ENCOUNTER — Encounter: Payer: Self-pay | Admitting: Nurse Practitioner

## 2022-12-11 ENCOUNTER — Other Ambulatory Visit: Payer: Self-pay | Admitting: Physician Assistant

## 2022-12-11 ENCOUNTER — Encounter: Payer: Self-pay | Admitting: Physician Assistant

## 2022-12-11 DIAGNOSIS — E669 Obesity, unspecified: Secondary | ICD-10-CM

## 2022-12-11 DIAGNOSIS — Z6836 Body mass index (BMI) 36.0-36.9, adult: Secondary | ICD-10-CM

## 2022-12-11 MED ORDER — ZEPBOUND 7.5 MG/0.5ML ~~LOC~~ SOAJ: 7.5000 mg | SUBCUTANEOUS | 2 refills | Status: DC

## 2022-12-11 NOTE — Telephone Encounter (Signed)
Medication is associated with BMI of 36.0-36.9. Does she need an obesity code as well?

## 2023-01-01 ENCOUNTER — Encounter: Payer: Self-pay | Admitting: Physician Assistant

## 2023-01-01 ENCOUNTER — Telehealth (INDEPENDENT_AMBULATORY_CARE_PROVIDER_SITE_OTHER): Admitting: Physician Assistant

## 2023-01-01 VITALS — Wt 173.0 lb

## 2023-01-01 DIAGNOSIS — E669 Obesity, unspecified: Secondary | ICD-10-CM

## 2023-01-01 DIAGNOSIS — Z6830 Body mass index (BMI) 30.0-30.9, adult: Secondary | ICD-10-CM | POA: Diagnosis not present

## 2023-01-01 DIAGNOSIS — E6609 Other obesity due to excess calories: Secondary | ICD-10-CM | POA: Diagnosis not present

## 2023-01-01 NOTE — Progress Notes (Unsigned)
Virtual Visit via Video Note  I connected with Terri Wade on 01/06/23 at  3:40 PM EDT by a video enabled telemedicine application and verified that I am speaking with the correct person using two identifiers.  Today's Provider: Jacquelin Hawking, MHS, PA-C Introduced myself to the patient as a PA-C and provided education on APPs in clinical practice.    Location: Patient: at home  Provider: [facility], [place], [place]    I discussed the limitations of evaluation and management by telemedicine and the availability of in person appointments. The patient expressed understanding and agreed to proceed.   Chief Complaint  Patient presents with   Weight Check    History of Present Illness:   She is taking Zepbound 7.5 mg weekly injection- she denies having issues with getting the 7.5 dose She does have some nausea in the initial first day after injection but report she feels good taking it She is down to 173 lbs  She is exercising regularly - She is exercising M-F for about an hour each day with cardio- was using Treadmill and is now using elliptical as well  She is also trying to incorporate weight training into her sessions     Observations/Objective:  Due to the nature of the virtual visit, physical exam and observations are limited. Able to obtain the following observations:   Alert, oriented, Appears comfortable, in no acute distress.  No scleral injection, no appreciated hoarseness, tachypnea, wheeze or strider. Able to maintain conversation without visible strain.  No cough appreciated during visit.    Assessment and Plan:  Problem List Items Addressed This Visit       Other   BMI 30.0-30.9,adult - Primary    Chronic, historic condition Patient is currently taking that down 7.5 weekly injection and states that she is doing well on current dose She reports that she is down to 173 pounds She reports that she is also exercising regularly 5 days a week for  about an hour each day with cardio and is trying to incorporate weight training as well Recommend she continues with current regimen Patient appears to have refills available for current medication regimen which should last until her next follow-up appointment Continue current regimen and follow-up with any concerns      Class 1 obesity due to excess calories without serious comorbidity with body mass index (BMI) of 30.0 to 30.9 in adult     Follow Up Instructions:    I discussed the assessment and treatment plan with the patient. The patient was provided an opportunity to ask questions and all were answered. The patient agreed with the plan and demonstrated an understanding of the instructions.   The patient was advised to call back or seek an in-person evaluation if the symptoms worsen or if the condition fails to improve as anticipated.  I provided 7 minutes of non-face-to-face time during this encounter.  Return in about 3 months (around 04/03/2023) for weight monitoring , Depression.   I, Jhovani Griswold E Deneshia Zucker, PA-C, have reviewed all documentation for this visit. The documentation on 01/06/23 for the exam, diagnosis, procedures, and orders are all accurate and complete.   Jacquelin Hawking, MHS, PA-C [facility]

## 2023-01-06 DIAGNOSIS — E6609 Other obesity due to excess calories: Secondary | ICD-10-CM | POA: Insufficient documentation

## 2023-01-06 NOTE — Assessment & Plan Note (Signed)
Chronic, historic condition Patient is currently taking that down 7.5 weekly injection and states that she is doing well on current dose She reports that she is down to 173 pounds She reports that she is also exercising regularly 5 days a week for about an hour each day with cardio and is trying to incorporate weight training as well Recommend she continues with current regimen Patient appears to have refills available for current medication regimen which should last until her next follow-up appointment Continue current regimen and follow-up with any concerns

## 2023-02-22 ENCOUNTER — Telehealth (INDEPENDENT_AMBULATORY_CARE_PROVIDER_SITE_OTHER): Admitting: Nurse Practitioner

## 2023-02-22 ENCOUNTER — Encounter: Payer: Self-pay | Admitting: Nurse Practitioner

## 2023-02-22 VITALS — Wt 163.0 lb

## 2023-02-22 DIAGNOSIS — Z6830 Body mass index (BMI) 30.0-30.9, adult: Secondary | ICD-10-CM

## 2023-02-22 DIAGNOSIS — E66811 Obesity, class 1: Secondary | ICD-10-CM

## 2023-02-22 DIAGNOSIS — E6609 Other obesity due to excess calories: Secondary | ICD-10-CM | POA: Diagnosis not present

## 2023-02-22 MED ORDER — ZEPBOUND 10 MG/0.5ML ~~LOC~~ SOAJ: 10.0000 mg | SUBCUTANEOUS | 1 refills | Status: DC

## 2023-02-22 NOTE — Assessment & Plan Note (Signed)
Chronic. Has lost about 40lbs since starting Zepbound.  She is doing well without side effects.  Will increase dose to 10mg  weekly.  Follow up in 2 months.  Will check labs at next visit.

## 2023-02-22 NOTE — Progress Notes (Signed)
Wt 163 lb (73.9 kg)   BMI 28.87 kg/m    Subjective:    Patient ID: Terri Wade, female    DOB: 18-Jul-1995, 27 y.o.   MRN: [id number]  HPI: Terri Wade is a 27 y.o. female  Chief Complaint  Patient presents with   weight mangement   WEIGHT MANAGEMENT Patient is currently taking Zepbound.  She was able to get the Zepbound.  She had trouble getting the medication from the pharmacy.  She weighed herself yesterday at 163lb.  She has lost a total of 40lbs since she started the medication.  She is taking the 7.5mg  of Zepbound.  She has been on the 7.5mg  dose and feels like over the last month her weight loss has plateaued.      Relevant past medical, surgical, family and social history reviewed and updated as indicated. Interim medical history since our last visit reviewed. Allergies and medications reviewed and updated.  Review of Systems  Constitutional:  Negative for unexpected weight change.    Per HPI unless specifically indicated above     Objective:    Wt 163 lb (73.9 kg)   BMI 28.87 kg/m   Wt Readings from Last 3 Encounters:  02/22/23 163 lb (73.9 kg)  01/01/23 173 lb (78.5 kg)  10/01/22 199 lb 3.2 oz (90.4 kg)    Physical Exam Vitals and nursing note reviewed.  HENT:     Head: Normocephalic.     Right Ear: Hearing normal.     Left Ear: Hearing normal.     Nose: Nose normal.  Eyes:     Pupils: Pupils are equal, round, and reactive to light.  Pulmonary:     Effort: Pulmonary effort is normal. No respiratory distress.  Neurological:     Mental Status: She is alert.  Psychiatric:        Mood and Affect: Mood normal.        Behavior: Behavior normal.        Thought Content: Thought content normal.        Judgment: Judgment normal.     Results for orders placed or performed in visit on 10/01/22  Comp Met (CMET)  Result Value Ref Range   Glucose 81 70 - 99 mg/dL   BUN 12 6 - 20 mg/dL   Creatinine, Ser 0.10 0.57 - 1.00 mg/dL   eGFR  272 >53 GU/YQI/3.47   BUN/Creatinine Ratio 20 9 - 23   Sodium 137 134 - 144 mmol/L   Potassium 4.2 3.5 - 5.2 mmol/L   Chloride 101 96 - 106 mmol/L   CO2 23 20 - 29 mmol/L   Calcium 9.9 8.7 - 10.2 mg/dL   Total Protein 7.8 6.0 - 8.5 g/dL   Albumin 4.6 4.0 - 5.0 g/dL   Globulin, Total 3.2 1.5 - 4.5 g/dL   Albumin/Globulin Ratio 1.4 1.2 - 2.2   Bilirubin Total 0.3 0.0 - 1.2 mg/dL   Alkaline Phosphatase 94 44 - 121 IU/L   AST 14 0 - 40 IU/L   ALT 10 0 - 32 IU/L      Assessment & Plan:   Problem List Items Addressed This Visit       Other   Class 1 obesity due to excess calories without serious comorbidity with body mass index (BMI) of 30.0 to 30.9 in adult - Primary    Chronic. Has lost about 40lbs since starting Zepbound.  She is doing well without side effects.  Will increase dose to  10mg  weekly.  Follow up in 2 months.  Will check labs at next visit.       Relevant Medications   tirzepatide (ZEPBOUND) 10 MG/0.5ML Pen      Follow up plan: Return in about 2 months (around 04/24/2023) for Weight Managment.   This visit was completed via MyChart due to the restrictions of the COVID-19 pandemic. All issues as above were discussed and addressed. Physical exam was done as above through visual confirmation on MyChart. If it was felt that the patient should be evaluated in the office, they were directed there. The patient verbally consented to this visit. Location of the patient: Home Location of the provider: Office Those involved with this call:  Provider: Larae Grooms, NP CMA: Maggie Font, CMA Front Desk/Registration: Servando Snare This encounter was conducted via video.  I spent 30 dedicated to the care of this patient on the date of this encounter to include previsit review of symptoms, plan of care and follow up, face to face time with the patient, and post visit ordering of testing.

## 2023-03-31 ENCOUNTER — Ambulatory Visit: Admitting: Nurse Practitioner

## 2023-04-16 ENCOUNTER — Ambulatory Visit: Admitting: Nurse Practitioner

## 2023-04-16 ENCOUNTER — Encounter: Payer: Self-pay | Admitting: Nurse Practitioner

## 2023-04-16 VITALS — BP 93/62 | HR 74 | Temp 98.8°F | Ht 63.0 in | Wt 154.0 lb

## 2023-04-16 DIAGNOSIS — Z23 Encounter for immunization: Secondary | ICD-10-CM | POA: Diagnosis not present

## 2023-04-16 DIAGNOSIS — R634 Abnormal weight loss: Secondary | ICD-10-CM | POA: Diagnosis not present

## 2023-04-16 DIAGNOSIS — Z6830 Body mass index (BMI) 30.0-30.9, adult: Secondary | ICD-10-CM

## 2023-04-16 DIAGNOSIS — E66811 Obesity, class 1: Secondary | ICD-10-CM

## 2023-04-16 DIAGNOSIS — Z01818 Encounter for other preprocedural examination: Secondary | ICD-10-CM | POA: Diagnosis not present

## 2023-04-16 DIAGNOSIS — F339 Major depressive disorder, recurrent, unspecified: Secondary | ICD-10-CM | POA: Diagnosis not present

## 2023-04-16 DIAGNOSIS — E6609 Other obesity due to excess calories: Secondary | ICD-10-CM

## 2023-04-16 LAB — URINALYSIS, ROUTINE W REFLEX MICROSCOPIC
Bilirubin, UA: NEGATIVE
Glucose, UA: NEGATIVE
Nitrite, UA: NEGATIVE
Protein,UA: NEGATIVE
Specific Gravity, UA: >1.03 — ABNORMAL HIGH (ref 1.005–1.030)
Urobilinogen, Ur: 0.2 mg/dL (ref 0.2–1.0)
pH, UA: 5.5 (ref 5.0–7.5)

## 2023-04-16 LAB — MICROSCOPIC EXAMINATION: Bacteria, UA: NONE SEEN

## 2023-04-16 NOTE — Progress Notes (Signed)
BP 93/62   Pulse 74   Temp 98.8 F (37.1 C) (Oral)   Ht 5\' 3"  (1.6 m)   Wt 154 lb (69.9 kg)   SpO2 98%   BMI 27.28 kg/m    Subjective:    Patient ID: Terri Wade, female    DOB: 1995-07-30, 27 y.o.   MRN: [id number]  HPI: Terri Wade is a 27 y.o. female  Chief Complaint  Patient presents with   Surgery Clearance    Breast reduction and abdominoplasty scheduled for 05/11/23   Patient is having a breast reduction and abdominoplasty on 05/11/23.  She is here to have a pre op clearance.   Denies HA, CP, SOB, dizziness, palpitations, visual changes, and lower extremity swelling.  Denies any recent illness.    Has lost over 50lbs and now plans to have the skin removed.      Relevant past medical, surgical, family and social history reviewed and updated as indicated. Interim medical history since our last visit reviewed. Allergies and medications reviewed and updated.  Review of Systems  Eyes:  Negative for visual disturbance.  Respiratory:  Negative for cough, chest tightness and shortness of breath.   Cardiovascular:  Negative for chest pain, palpitations and leg swelling.  Neurological:  Negative for dizziness and headaches.    Per HPI unless specifically indicated above     Objective:    BP 93/62   Pulse 74   Temp 98.8 F (37.1 C) (Oral)   Ht 5\' 3"  (1.6 m)   Wt 154 lb (69.9 kg)   SpO2 98%   BMI 27.28 kg/m   Wt Readings from Last 3 Encounters:  04/16/23 154 lb (69.9 kg)  02/22/23 163 lb (73.9 kg)  01/01/23 173 lb (78.5 kg)    Physical Exam Vitals and nursing note reviewed.  Constitutional:      General: She is awake. She is not in acute distress.    Appearance: She is well-developed. She is not ill-appearing.  HENT:     Head: Normocephalic and atraumatic.     Right Ear: Hearing, tympanic membrane, ear canal and external ear normal. No drainage.     Left Ear: Hearing, tympanic membrane, ear canal and external ear normal. No drainage.      Nose: Nose normal.     Right Sinus: No maxillary sinus tenderness or frontal sinus tenderness.     Left Sinus: No maxillary sinus tenderness or frontal sinus tenderness.     Mouth/Throat:     Mouth: Mucous membranes are moist.     Pharynx: Oropharynx is clear. Uvula midline. No pharyngeal swelling, oropharyngeal exudate or posterior oropharyngeal erythema.  Eyes:     General: Lids are normal.        Right eye: No discharge.        Left eye: No discharge.     Extraocular Movements: Extraocular movements intact.     Conjunctiva/sclera: Conjunctivae normal.     Pupils: Pupils are equal, round, and reactive to light.     Visual Fields: Right eye visual fields normal and left eye visual fields normal.  Neck:     Thyroid: No thyromegaly.     Vascular: No carotid bruit.     Trachea: Trachea normal.  Cardiovascular:     Rate and Rhythm: Normal rate and regular rhythm.     Heart sounds: Normal heart sounds. No murmur heard.    No gallop.  Pulmonary:     Effort: Pulmonary effort is normal. No  accessory muscle usage or respiratory distress.     Breath sounds: Normal breath sounds.  Chest:  Breasts:    Right: Normal.     Left: Normal.  Abdominal:     General: Bowel sounds are normal.     Palpations: Abdomen is soft. There is no hepatomegaly or splenomegaly.     Tenderness: There is no abdominal tenderness.  Musculoskeletal:        General: Normal range of motion.     Cervical back: Normal range of motion and neck supple.     Right lower leg: No edema.     Left lower leg: No edema.  Lymphadenopathy:     Head:     Right side of head: No submental, submandibular, tonsillar, preauricular or posterior auricular adenopathy.     Left side of head: No submental, submandibular, tonsillar, preauricular or posterior auricular adenopathy.     Cervical: No cervical adenopathy.     Upper Body:     Right upper body: No supraclavicular, axillary or pectoral adenopathy.     Left upper body: No  supraclavicular, axillary or pectoral adenopathy.  Skin:    General: Skin is warm and dry.     Capillary Refill: Capillary refill takes less than 2 seconds.     Findings: No rash.  Neurological:     Mental Status: She is alert and oriented to person, place, and time.     Gait: Gait is intact.  Psychiatric:        Attention and Perception: Attention normal.        Mood and Affect: Mood normal.        Speech: Speech normal.        Behavior: Behavior normal. Behavior is cooperative.        Thought Content: Thought content normal.        Judgment: Judgment normal.     Results for orders placed or performed in visit on 10/01/22  Comp Met (CMET)   Collection Time: 10/01/22 10:46 AM  Result Value Ref Range   Glucose 81 70 - 99 mg/dL   BUN 12 6 - 20 mg/dL   Creatinine, Ser 5.40 0.57 - 1.00 mg/dL   eGFR 981 >19 JY/NWG/9.56   BUN/Creatinine Ratio 20 9 - 23   Sodium 137 134 - 144 mmol/L   Potassium 4.2 3.5 - 5.2 mmol/L   Chloride 101 96 - 106 mmol/L   CO2 23 20 - 29 mmol/L   Calcium 9.9 8.7 - 10.2 mg/dL   Total Protein 7.8 6.0 - 8.5 g/dL   Albumin 4.6 4.0 - 5.0 g/dL   Globulin, Total 3.2 1.5 - 4.5 g/dL   Albumin/Globulin Ratio 1.4 1.2 - 2.2   Bilirubin Total 0.3 0.0 - 1.2 mg/dL   Alkaline Phosphatase 94 44 - 121 IU/L   AST 14 0 - 40 IU/L   ALT 10 0 - 32 IU/L      Assessment & Plan:   Problem List Items Addressed This Visit       Other   Depression, recurrent (HCC)   Class 1 obesity due to excess calories without serious comorbidity with body mass index (BMI) of 30.0 to 30.9 in adult   BMI now 27 after weight loss.  Advised to stop zepbound 2 weeks prior to surgery.  Has extra skin after weight loss that she would like removed.       Other Visit Diagnoses       Pre-op testing    -  Primary   Labs ordered at visit today.  Will send paperwork for clearance after results are returned.   Relevant Orders   CBC with Differential/Platelet   Comprehensive metabolic panel    Lipid panel   TSH   Urinalysis, Routine w reflex microscopic     Weight loss         Need for influenza vaccination       Relevant Orders   Flu vaccine trivalent PF, 6mos and older(Flulaval,Afluria,Fluarix,Fluzone) (Completed)        Follow up plan: No follow-ups on file.

## 2023-04-16 NOTE — Assessment & Plan Note (Signed)
BMI now 27 after weight loss.  Advised to stop zepbound 2 weeks prior to surgery.  Has extra skin after weight loss that she would like removed.

## 2023-04-17 LAB — CBC WITH DIFFERENTIAL/PLATELET
Basophils Absolute: 0.1 10*3/uL (ref 0.0–0.2)
Basos: 1 %
EOS (ABSOLUTE): 0.1 10*3/uL (ref 0.0–0.4)
Eos: 1 %
Hematocrit: 40.1 % (ref 34.0–46.6)
Hemoglobin: 12.9 g/dL (ref 11.1–15.9)
Immature Grans (Abs): 0 10*3/uL (ref 0.0–0.1)
Immature Granulocytes: 0 %
Lymphocytes Absolute: 2.7 10*3/uL (ref 0.7–3.1)
Lymphs: 35 %
MCH: 27.3 pg (ref 26.6–33.0)
MCHC: 32.2 g/dL (ref 31.5–35.7)
MCV: 85 fL (ref 79–97)
Monocytes Absolute: 0.5 10*3/uL (ref 0.1–0.9)
Monocytes: 6 %
Neutrophils Absolute: 4.3 10*3/uL (ref 1.4–7.0)
Neutrophils: 57 %
Platelets: 315 10*3/uL (ref 150–450)
RBC: 4.73 x10E6/uL (ref 3.77–5.28)
RDW: 13 % (ref 11.7–15.4)
WBC: 7.6 10*3/uL (ref 3.4–10.8)

## 2023-04-17 LAB — COMPREHENSIVE METABOLIC PANEL
ALT: 7 [IU]/L (ref 0–32)
AST: 14 [IU]/L (ref 0–40)
Albumin: 4.6 g/dL (ref 4.0–5.0)
Alkaline Phosphatase: 86 [IU]/L (ref 44–121)
BUN/Creatinine Ratio: 18 (ref 9–23)
BUN: 12 mg/dL (ref 6–20)
Bilirubin Total: 0.4 mg/dL (ref 0.0–1.2)
CO2: 23 mmol/L (ref 20–29)
Calcium: 9.5 mg/dL (ref 8.7–10.2)
Chloride: 101 mmol/L (ref 96–106)
Creatinine, Ser: 0.67 mg/dL (ref 0.57–1.00)
Globulin, Total: 2.7 g/dL (ref 1.5–4.5)
Glucose: 74 mg/dL (ref 70–99)
Potassium: 4.1 mmol/L (ref 3.5–5.2)
Sodium: 137 mmol/L (ref 134–144)
Total Protein: 7.3 g/dL (ref 6.0–8.5)
eGFR: 124 mL/min/{1.73_m2} (ref >59)

## 2023-04-17 LAB — LIPID PANEL
Chol/HDL Ratio: 2.5 {ratio} (ref 0.0–4.4)
Cholesterol, Total: 130 mg/dL (ref 100–199)
HDL: 51 mg/dL (ref >39)
LDL Chol Calc (NIH): 68 mg/dL (ref 0–99)
Triglycerides: 45 mg/dL (ref 0–149)
VLDL Cholesterol Cal: 11 mg/dL (ref 5–40)

## 2023-04-17 LAB — TSH: TSH: 2.16 u[IU]/mL (ref 0.450–4.500)

## 2023-04-19 NOTE — Telephone Encounter (Signed)
Do you have a clearance form for them?

## 2023-04-20 ENCOUNTER — Encounter: Payer: Self-pay | Admitting: Nurse Practitioner

## 2023-05-12 ENCOUNTER — Encounter: Payer: Self-pay | Admitting: Nurse Practitioner

## 2023-06-15 ENCOUNTER — Encounter: Payer: Self-pay | Admitting: Nurse Practitioner

## 2023-06-15 ENCOUNTER — Telehealth: Admitting: Nurse Practitioner

## 2023-06-15 DIAGNOSIS — E66811 Obesity, class 1: Secondary | ICD-10-CM | POA: Diagnosis not present

## 2023-06-15 DIAGNOSIS — E6609 Other obesity due to excess calories: Secondary | ICD-10-CM | POA: Diagnosis not present

## 2023-06-15 MED ORDER — ZEPBOUND 5 MG/0.5ML ~~LOC~~ SOAJ: 5.0000 mg | SUBCUTANEOUS | 0 refills | Status: DC

## 2023-06-15 MED ORDER — ZEPBOUND 2.5 MG/0.5ML ~~LOC~~ SOAJ: 2.5000 mg | SUBCUTANEOUS | 0 refills | Status: DC

## 2023-06-15 NOTE — Progress Notes (Signed)
There were no vitals taken for this visit.   Subjective:    Patient ID: Terri Wade, female    DOB: 11/14/1995, 28 y.o.   MRN: [id number]  HPI: Terri Wade is a 28 y.o. female  Chief Complaint  Patient presents with   Weight Management Screening   WEIGHT MANAGEMENT Patient is currently taking Zepbound.  She hasn't taken the medication since her surgery.  She had a hard time eating after surgery.  She currently weighs 154lbs.  She hasn't been on Zepbound for about 6 weeks and wants to restart.  Her weight loss goal is 140 lbs.     Relevant past medical, surgical, family and social history reviewed and updated as indicated. Interim medical history since our last visit reviewed. Allergies and medications reviewed and updated.  Review of Systems  Constitutional:  Negative for unexpected weight change.    Per HPI unless specifically indicated above     Objective:    There were no vitals taken for this visit.  Wt Readings from Last 3 Encounters:  04/16/23 154 lb (69.9 kg)  02/22/23 163 lb (73.9 kg)  01/01/23 173 lb (78.5 kg)    Physical Exam Vitals and nursing note reviewed.  HENT:     Head: Normocephalic.     Right Ear: Hearing normal.     Left Ear: Hearing normal.     Nose: Nose normal.  Eyes:     Pupils: Pupils are equal, round, and reactive to light.  Pulmonary:     Effort: Pulmonary effort is normal. No respiratory distress.  Neurological:     Mental Status: She is alert.  Psychiatric:        Mood and Affect: Mood normal.        Behavior: Behavior normal.        Thought Content: Thought content normal.        Judgment: Judgment normal.     Results for orders placed or performed in visit on 04/16/23  Microscopic Examination   Collection Time: 04/16/23 11:29 AM   Urine  Result Value Ref Range   WBC, UA 0-5 0 - 5 /hpf   RBC, Urine 0-2 0 - 2 /hpf   Epithelial Cells (non renal) 0-10 0 - 10 /hpf   Mucus, UA Present (A) Not Estab.    Bacteria, UA None seen None seen/Few  Urinalysis, Routine w reflex microscopic   Collection Time: 04/16/23 11:29 AM  Result Value Ref Range   Specific Gravity, UA >1.030 (H) 1.005 - 1.030   pH, UA 5.5 5.0 - 7.5   Color, UA Yellow Yellow   Appearance Ur Clear Clear   Leukocytes,UA Trace (A) Negative   Protein,UA Negative Negative/Trace   Glucose, UA Negative Negative   Ketones, UA 1+ (A) Negative   RBC, UA 2+ (A) Negative   Bilirubin, UA Negative Negative   Urobilinogen, Ur 0.2 0.2 - 1.0 mg/dL   Nitrite, UA Negative Negative   Microscopic Examination See below:   CBC with Differential/Platelet   Collection Time: 04/16/23 11:30 AM  Result Value Ref Range   WBC 7.6 3.4 - 10.8 x10E3/uL   RBC 4.73 3.77 - 5.28 x10E6/uL   Hemoglobin 12.9 11.1 - 15.9 g/dL   Hematocrit 40.9 81.1 - 46.6 %   MCV 85 79 - 97 fL   MCH 27.3 26.6 - 33.0 pg   MCHC 32.2 31.5 - 35.7 g/dL   RDW 91.4 78.2 - 95.6 %   Platelets 315 150 - 450  x10E3/uL   Neutrophils 57 Not Estab. %   Lymphs 35 Not Estab. %   Monocytes 6 Not Estab. %   Eos 1 Not Estab. %   Basos 1 Not Estab. %   Neutrophils Absolute 4.3 1.4 - 7.0 x10E3/uL   Lymphocytes Absolute 2.7 0.7 - 3.1 x10E3/uL   Monocytes Absolute 0.5 0.1 - 0.9 x10E3/uL   EOS (ABSOLUTE) 0.1 0.0 - 0.4 x10E3/uL   Basophils Absolute 0.1 0.0 - 0.2 x10E3/uL   Immature Granulocytes 0 Not Estab. %   Immature Grans (Abs) 0.0 0.0 - 0.1 x10E3/uL  Comprehensive metabolic panel   Collection Time: 04/16/23 11:30 AM  Result Value Ref Range   Glucose 74 70 - 99 mg/dL   BUN 12 6 - 20 mg/dL   Creatinine, Ser 4.09 0.57 - 1.00 mg/dL   eGFR 811 >91 YN/WGN/5.62   BUN/Creatinine Ratio 18 9 - 23   Sodium 137 134 - 144 mmol/L   Potassium 4.1 3.5 - 5.2 mmol/L   Chloride 101 96 - 106 mmol/L   CO2 23 20 - 29 mmol/L   Calcium 9.5 8.7 - 10.2 mg/dL   Total Protein 7.3 6.0 - 8.5 g/dL   Albumin 4.6 4.0 - 5.0 g/dL   Globulin, Total 2.7 1.5 - 4.5 g/dL   Bilirubin Total 0.4 0.0 - 1.2 mg/dL    Alkaline Phosphatase 86 44 - 121 IU/L   AST 14 0 - 40 IU/L   ALT 7 0 - 32 IU/L  Lipid panel   Collection Time: 04/16/23 11:30 AM  Result Value Ref Range   Cholesterol, Total 130 100 - 199 mg/dL   Triglycerides 45 0 - 149 mg/dL   HDL 51 >13 mg/dL   VLDL Cholesterol Cal 11 5 - 40 mg/dL   LDL Chol Calc (NIH) 68 0 - 99 mg/dL   Chol/HDL Ratio 2.5 0.0 - 4.4 ratio  TSH   Collection Time: 04/16/23 11:30 AM  Result Value Ref Range   TSH 2.160 0.450 - 4.500 uIU/mL      Assessment & Plan:   Problem List Items Addressed This Visit       Other   Class 1 obesity due to excess calories without serious comorbidity with body mass index (BMI) of 30.0 to 30.9 in adult - Primary   Chronic.  Improving with Zepbound.  Due to patient being off medication for at least 6 weeks, will restart at 2.5mg  weekly.  After 1 month increase to 5mg  weekly.  Follow up in 2 months.  Call sooner if concerns arise.       Relevant Medications   tirzepatide (ZEPBOUND) 2.5 MG/0.5ML Pen   tirzepatide (ZEPBOUND) 5 MG/0.5ML Pen       Follow up plan: Return in about 2 months (around 08/13/2023) for Weight Managment.   This visit was completed via MyChart due to the restrictions of the COVID-19 pandemic. All issues as above were discussed and addressed. Physical exam was done as above through visual confirmation on MyChart. If it was felt that the patient should be evaluated in the office, they were directed there. The patient verbally consented to this visit. Location of the patient: Home Location of the provider: Office Those involved with this call:  Provider: Larae Grooms, NP CMA: Oswaldo Conroy, CMA Front Desk/Registration: Servando Snare This encounter was conducted via video.  I spent 20 dedicated to the care of this patient on the date of this encounter to include previsit review of symptoms, plan of care and follow up,  face to face time with the patient, and post visit ordering of testing.

## 2023-06-15 NOTE — Assessment & Plan Note (Signed)
Chronic.  Improving with Zepbound.  Due to patient being off medication for at least 6 weeks, will restart at 2.5mg  weekly.  After 1 month increase to 5mg  weekly.  Follow up in 2 months.  Call sooner if concerns arise.

## 2023-10-26 ENCOUNTER — Other Ambulatory Visit: Payer: Self-pay | Admitting: Nurse Practitioner

## 2023-10-27 NOTE — Telephone Encounter (Signed)
 Please find out if patient wants to increase to Zepbound  5mg .

## 2023-10-27 NOTE — Telephone Encounter (Signed)
 Requested medication (s) are due for refill today -yes  Requested medication (s) are on the active medication list -yes  Future visit scheduled -no  Last refill: 06/15/23 2ml  Notes to clinic: off protocol- provider review   Requested Prescriptions  Pending Prescriptions Disp Refills   ZEPBOUND  5 MG/0.5ML Pen [Pharmacy Med Name: ZEPBOUND  5MG /0.5ML INJ (4 PF PENS)] 2 mL 0    Sig: ADMINISTER 5 MG UNDER THE SKIN 1 TIME A WEEK     Off-Protocol Failed - 10/27/2023  3:49 PM      Failed - Medication not assigned to a protocol, review manually.      Passed - Valid encounter within last 12 months    Recent Outpatient Visits           4 months ago Class 1 obesity due to excess calories without serious comorbidity with body mass index (BMI) of 30.0 to 30.9 in adult   [facility] Melvin Pao, NP                 Requested Prescriptions  Pending Prescriptions Disp Refills   ZEPBOUND  5 MG/0.5ML Pen [Pharmacy Med Name: ZEPBOUND  5MG /0.5ML INJ (4 PF PENS)] 2 mL 0    Sig: ADMINISTER 5 MG UNDER THE SKIN 1 TIME A WEEK     Off-Protocol Failed - 10/27/2023  3:49 PM      Failed - Medication not assigned to a protocol, review manually.      Passed - Valid encounter within last 12 months    Recent Outpatient Visits           4 months ago Class 1 obesity due to excess calories without serious comorbidity with body mass index (BMI) of 30.0 to 30.9 in adult   [facility] Melvin Pao, NP

## 2023-10-28 NOTE — Telephone Encounter (Signed)
 Called and spoke to patient. She would like to increase to the 5 mg.

## 2023-12-06 ENCOUNTER — Other Ambulatory Visit: Payer: Self-pay | Admitting: Nurse Practitioner

## 2023-12-07 NOTE — Telephone Encounter (Signed)
 Requested medication (s) are due for refill today: yes  Requested medication (s) are on the active medication list: yes  Last refill:  10/28/23 2 ml   Future visit scheduled: no  Notes to clinic:  med not assigned a protocol    Requested Prescriptions  Pending Prescriptions Disp Refills   ZEPBOUND  5 MG/0.5ML Pen [Pharmacy Med Name: ZEPBOUND  5MG /0.5ML INJ (4 PF PENS)] 2 mL 0    Sig: ADMINISTER 5 MG UNDER THE SKIN 1 TIME A WEEK     Off-Protocol Failed - 12/07/2023  1:42 PM      Failed - Medication not assigned to a protocol, review manually.      Passed - Valid encounter within last 12 months    Recent Outpatient Visits           5 months ago Class 1 obesity due to excess calories without serious comorbidity with body mass index (BMI) of 30.0 to 30.9 in adult   [facility] Melvin Pao, NP

## 2024-01-04 ENCOUNTER — Other Ambulatory Visit (HOSPITAL_COMMUNITY): Payer: Self-pay

## 2024-01-17 ENCOUNTER — Encounter: Payer: Self-pay | Admitting: Nurse Practitioner

## 2024-01-18 MED ORDER — ZEPBOUND 7.5 MG/0.5ML ~~LOC~~ SOAJ: 7.5000 mg | SUBCUTANEOUS | 2 refills | Status: DC

## 2024-01-20 ENCOUNTER — Other Ambulatory Visit (HOSPITAL_COMMUNITY): Payer: Self-pay

## 2024-01-24 ENCOUNTER — Other Ambulatory Visit (HOSPITAL_COMMUNITY): Payer: Self-pay

## 2024-01-24 ENCOUNTER — Telehealth: Payer: Self-pay

## 2024-01-24 NOTE — Telephone Encounter (Signed)
 Pharmacy Patient Advocate Encounter   Received notification from Onbase that prior authorization for Zepbound  7.5MG /0.5ML pen-injectors  is required/requested.   Insurance verification completed.   The patient is insured through [employer] .   Per test claim: PA required; PA submitted to above mentioned insurance via Latent Key/confirmation #/EOC [id number] Status is pending

## 2024-01-24 NOTE — Telephone Encounter (Signed)
 Pharmacy Patient Advocate Encounter  Received notification from [employer] that Prior Authorization for Zepbound  7.5MG /0.5ML pen-injectors  has been APPROVED from 01/24/24 to 01/23/25. Ran test claim, Copay is $24. This test claim was processed through [facility]- copay amounts may vary at other pharmacies due to pharmacy/plan contracts, or as the patient moves through the different stages of their insurance plan.   PA #/Case ID/Reference #: [id number]

## 2024-03-20 ENCOUNTER — Encounter: Payer: Self-pay | Admitting: Nurse Practitioner

## 2024-03-22 ENCOUNTER — Ambulatory Visit: Payer: Self-pay

## 2024-03-22 NOTE — Telephone Encounter (Signed)
 FYI Only or Action Required?: FYI only for provider: appointment scheduled on 03/24/24.  Patient was last seen in primary care on 06/15/2023 by Melvin Pao, NP.  Called Nurse Triage reporting Palpitations.  Symptoms began several months ago.  Interventions attempted: Rest, hydration, or home remedies.  Symptoms are: gradually worsening.  Triage Disposition: See PCP When Office is Open (Within 3 Days)  Patient/caregiver understands and will follow disposition?: Yes   Copied from CRM [id number]. Topic: Appointments - Appointment Scheduling >> Mar 22, 2024  9:06 AM Emylou G wrote: heart palpitations.. 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 She notes she will experience heart flutter up to 15 seconds while she is in high stress situations. She works in a insurance account manager office and has noted increased frequency of heart flutter, sometimes will happen two or three times in one day. She does experience mild shortness of breath and clammy palms with episode that resolves within a few seconds. She denies chest pain, numbness, tingling, vision changes, and all other symptoms. Currently asymptomatic, last episode 03/21/24, requesting evaluation on Friday for increasing frequency of heart flutter. Patient aware of UC/ER precautions.     1. DESCRIPTION: Please describe your heart rate or heartbeat that you are having (e.g., fast/slow, regular/irregular, skipped or extra beats, palpitations)     Feels heart fluttering  2. ONSET: When did it start? (e.g., minutes, hours, days)      Chronic but more frequent  3. DURATION: How long does it last (e.g., seconds, minutes, hours)     15 seconds. Will happen several times per day 4. PATTERN Does it come and go, or  has it been constant since it started?  Does it get worse with exertion?   Are you feeling it now?     Intermittent  5. TAP: Using your hand, can you tap out what you are feeling on a chair or table in front of you, so that I can hear? Note: Not all patients can do this.        6. HEART RATE: Can you tell me your heart rate? How many beats in 15 seconds?  Note: Not all patients can do this.        7. RECURRENT SYMPTOM: Have you ever had this before? If Yes, ask: When was the last time? and What happened that time?      Yes-chronic 8. CAUSE: What do you think is causing the palpitations?     high stress 9. CARDIAC HISTORY: Do you have any history of heart disease? (e.g., heart attack, angina, bypass surgery, angioplasty, arrhythmia)       10. OTHER SYMPTOMS: Do you have any other symptoms? (e.g., dizziness, chest pain, sweating, difficulty breathing)       Will get slightly dizzy and clammy when feeling heart flutter 11. PREGNANCY: Is there any chance you are pregnant? When was your last menstrual period?  Protocols used: Heart Rate and Heartbeat Questions-A-AH

## 2024-03-24 ENCOUNTER — Ambulatory Visit: Admitting: Nurse Practitioner

## 2024-04-03 ENCOUNTER — Encounter: Payer: Self-pay | Admitting: Nurse Practitioner

## 2024-04-03 NOTE — Telephone Encounter (Signed)
 Scheduled

## 2024-04-07 ENCOUNTER — Encounter: Payer: Self-pay | Admitting: Nurse Practitioner

## 2024-04-07 ENCOUNTER — Telehealth: Admitting: Nurse Practitioner

## 2024-04-07 MED ORDER — SERTRALINE HCL 25 MG PO TABS: 25.0000 mg | ORAL_TABLET | Freq: Every day | ORAL | 0 refills | Status: AC

## 2024-04-07 MED ORDER — ZEPBOUND 5 MG/0.5ML ~~LOC~~ SOAJ: 5.0000 mg | SUBCUTANEOUS | 2 refills | Status: AC

## 2024-04-07 NOTE — Progress Notes (Signed)
 Ht 5' 3 (1.6 m)   Wt 145 lb (65.8 kg)   BMI 25.69 kg/m    Subjective:    Patient ID: Terri Wade, female    DOB: [birth year], 28 y.o.   MRN: [id number]  HPI: Terri Wade is a 28 y.o. female  Chief Complaint  Patient presents with   office visit    NP. Patient states she is dealing with Anxiety and she is starting to have heart palpitation now. She was taking Zoloft  in the past.    ANXIETY/STRESS Patient states she has been having a lot of anxiety again.  Very quick onset. Not sure what caused it to come back.  She has been very stressed with work.  Now she feels like she is having palpitations.  She feels like the anxiety kind of builds.  Sometimes she feels like she has to take an extra breath.  She has noticed less desire to do things that she enjoys.    WEIGHT MANAGEMENT  Patient is trying to maintain her weight her.  She would like to decrease to the Zepbound  5mg  weekly.    Relevant past medical, surgical, family and social history reviewed and updated as indicated. Interim medical history since our last visit reviewed. Allergies and medications reviewed and updated.  Review of Systems  Cardiovascular:  Positive for palpitations.  Psychiatric/Behavioral:  Negative for suicidal ideas. The patient is nervous/anxious.     Per HPI unless specifically indicated above     Objective:    Ht 5' 3 (1.6 m)   Wt 145 lb (65.8 kg)   BMI 25.69 kg/m   Wt Readings from Last 3 Encounters:  04/07/24 145 lb (65.8 kg)  04/16/23 154 lb (69.9 kg)  02/22/23 163 lb (73.9 kg)    Physical Exam Vitals and nursing note reviewed.  HENT:     Head: Normocephalic.     Right Ear: Hearing normal.     Left Ear: Hearing normal.     Nose: Nose normal.  Eyes:     Pupils: Pupils are equal, round, and reactive to light.  Pulmonary:     Effort: Pulmonary effort is normal. No respiratory distress.  Neurological:     Mental Status: She is alert.  Psychiatric:        Mood  and Affect: Mood normal.        Behavior: Behavior normal.        Thought Content: Thought content normal.        Judgment: Judgment normal.     Results for orders placed or performed in visit on 04/16/23  Microscopic Examination   Collection Time: 04/16/23 11:29 AM   Urine  Result Value Ref Range   WBC, UA 0-5 0 - 5 /hpf   RBC, Urine 0-2 0 - 2 /hpf   Epithelial Cells (non renal) 0-10 0 - 10 /hpf   Mucus, UA Present (A) Not Estab.   Bacteria, UA None seen None seen/Few  Urinalysis, Routine w reflex microscopic   Collection Time: 04/16/23 11:29 AM  Result Value Ref Range   Specific Gravity, UA >1.030 (H) 1.005 - 1.030   pH, UA 5.5 5.0 - 7.5   Color, UA Yellow Yellow   Appearance Ur Clear Clear   Leukocytes,UA Trace (A) Negative   Protein,UA Negative Negative/Trace   Glucose, UA Negative Negative   Ketones, UA 1+ (A) Negative   RBC, UA 2+ (A) Negative   Bilirubin, UA Negative Negative   Urobilinogen, Ur 0.2 0.2 -  1.0 mg/dL   Nitrite, UA Negative Negative   Microscopic Examination See below:   CBC with Differential/Platelet   Collection Time: 04/16/23 11:30 AM  Result Value Ref Range   WBC 7.6 3.4 - 10.8 x10E3/uL   RBC 4.73 3.77 - 5.28 x10E6/uL   Hemoglobin 12.9 11.1 - 15.9 g/dL   Hematocrit 59.8 65.9 - 46.6 %   MCV 85 79 - 97 fL   MCH 27.3 26.6 - 33.0 pg   MCHC 32.2 31.5 - 35.7 g/dL   RDW 86.9 88.2 - 84.5 %   Platelets 315 150 - 450 x10E3/uL   Neutrophils 57 Not Estab. %   Lymphs 35 Not Estab. %   Monocytes 6 Not Estab. %   Eos 1 Not Estab. %   Basos 1 Not Estab. %   Neutrophils Absolute 4.3 1.4 - 7.0 x10E3/uL   Lymphocytes Absolute 2.7 0.7 - 3.1 x10E3/uL   Monocytes Absolute 0.5 0.1 - 0.9 x10E3/uL   EOS (ABSOLUTE) 0.1 0.0 - 0.4 x10E3/uL   Basophils Absolute 0.1 0.0 - 0.2 x10E3/uL   Immature Granulocytes 0 Not Estab. %   Immature Grans (Abs) 0.0 0.0 - 0.1 x10E3/uL  Comprehensive metabolic panel   Collection Time: 04/16/23 11:30 AM  Result Value Ref Range    Glucose 74 70 - 99 mg/dL   BUN 12 6 - 20 mg/dL   Creatinine, Ser 9.32 0.57 - 1.00 mg/dL   eGFR 875 >40 fO/fpw/8.26   BUN/Creatinine Ratio 18 9 - 23   Sodium 137 134 - 144 mmol/L   Potassium 4.1 3.5 - 5.2 mmol/L   Chloride 101 96 - 106 mmol/L   CO2 23 20 - 29 mmol/L   Calcium 9.5 8.7 - 10.2 mg/dL   Total Protein 7.3 6.0 - 8.5 g/dL   Albumin 4.6 4.0 - 5.0 g/dL   Globulin, Total 2.7 1.5 - 4.5 g/dL   Bilirubin Total 0.4 0.0 - 1.2 mg/dL   Alkaline Phosphatase 86 44 - 121 IU/L   AST 14 0 - 40 IU/L   ALT 7 0 - 32 IU/L  Lipid panel   Collection Time: 04/16/23 11:30 AM  Result Value Ref Range   Cholesterol, Total 130 100 - 199 mg/dL   Triglycerides 45 0 - 149 mg/dL   HDL 51 >60 mg/dL   VLDL Cholesterol Cal 11 5 - 40 mg/dL   LDL Chol Calc (NIH) 68 0 - 99 mg/dL   Chol/HDL Ratio 2.5 0.0 - 4.4 ratio  TSH   Collection Time: 04/16/23 11:30 AM  Result Value Ref Range   TSH 2.160 0.450 - 4.500 uIU/mL      Assessment & Plan:   Problem List Items Addressed This Visit   None    Follow up plan: Return in about 2 weeks (around 04/21/2024) for Depression/Anxiety FU (virtual).   This visit was completed via MyChart due to the restrictions of the COVID-19 pandemic. All issues as above were discussed and addressed. Physical exam was done as above through visual confirmation on MyChart. If it was felt that the patient should be evaluated in the office, they were directed there. The patient verbally consented to this visit. Location of the patient: Home Location of the provider: Office Those involved with this call:  Provider: Darice Petty, NP CMA: Joya Louder, CMA Front Desk/Registration: Claretta Maiden This encounter was conducted via video.  I spent 30 minutes dedicated to the care of this patient on the date of this encounter to include previsit review of plan  of care, follow up and medications, face to face time with the patient, and post visit ordering of testing.

## 2024-04-21 ENCOUNTER — Ambulatory Visit: Admitting: Nurse Practitioner

## 2024-05-22 ENCOUNTER — Ambulatory Visit: Admitting: Nurse Practitioner

## 2024-05-26 ENCOUNTER — Ambulatory Visit: Admitting: Nurse Practitioner

## 2024-06-02 ENCOUNTER — Ambulatory Visit: Admitting: Nurse Practitioner

## 2024-06-16 ENCOUNTER — Ambulatory Visit: Admitting: Nurse Practitioner
